# Patient Record
Sex: Female | Born: 1975 | Race: White | Hispanic: No | Marital: Married | Smoking: Current every day smoker
Health system: Southern US, Community
[De-identification: ages and names within clinical notes are randomized; demographics above are authoritative.]

## PROBLEM LIST (undated history)

## (undated) DIAGNOSIS — F419 Anxiety disorder, unspecified: Secondary | ICD-10-CM

## (undated) DIAGNOSIS — R0989 Other specified symptoms and signs involving the circulatory and respiratory systems: Secondary | ICD-10-CM

## (undated) DIAGNOSIS — F319 Bipolar disorder, unspecified: Secondary | ICD-10-CM

## (undated) DIAGNOSIS — N3010 Interstitial cystitis (chronic) without hematuria: Secondary | ICD-10-CM

## (undated) DIAGNOSIS — F32A Depression, unspecified: Secondary | ICD-10-CM

## (undated) DIAGNOSIS — F329 Major depressive disorder, single episode, unspecified: Secondary | ICD-10-CM

## (undated) HISTORY — PX: WISDOM TOOTH EXTRACTION: SHX21

## (undated) HISTORY — PX: OTHER SURGICAL HISTORY: SHX169

## (undated) HISTORY — PX: BREAST ENHANCEMENT SURGERY: SHX7

## (undated) HISTORY — PX: ABDOMINAL HYSTERECTOMY: SHX81

---

## 2000-09-11 ENCOUNTER — Other Ambulatory Visit: Admission: RE | Admit: 2000-09-11 | Discharge: 2000-09-11 | Payer: Self-pay | Admitting: Obstetrics and Gynecology

## 2001-05-15 ENCOUNTER — Encounter: Payer: Self-pay | Admitting: Obstetrics and Gynecology

## 2001-05-15 ENCOUNTER — Inpatient Hospital Stay (HOSPITAL_COMMUNITY): Admission: AD | Admit: 2001-05-15 | Discharge: 2001-05-15 | Payer: Self-pay | Admitting: Obstetrics and Gynecology

## 2006-09-08 DIAGNOSIS — N3010 Interstitial cystitis (chronic) without hematuria: Secondary | ICD-10-CM

## 2006-09-08 HISTORY — DX: Interstitial cystitis (chronic) without hematuria: N30.10

## 2009-10-13 ENCOUNTER — Emergency Department (HOSPITAL_BASED_OUTPATIENT_CLINIC_OR_DEPARTMENT_OTHER): Admission: EM | Admit: 2009-10-13 | Discharge: 2009-10-14 | Payer: Self-pay | Admitting: Emergency Medicine

## 2011-01-07 ENCOUNTER — Other Ambulatory Visit: Payer: Self-pay | Admitting: Family Medicine

## 2011-01-07 DIAGNOSIS — N6310 Unspecified lump in the right breast, unspecified quadrant: Secondary | ICD-10-CM

## 2011-01-13 ENCOUNTER — Ambulatory Visit
Admission: RE | Admit: 2011-01-13 | Discharge: 2011-01-13 | Disposition: A | Discharge status: Home / Self Care | Payer: Private Health Insurance - Indemnity | Source: Ambulatory Visit | Attending: Family Medicine | Admitting: Family Medicine

## 2011-01-13 DIAGNOSIS — N6310 Unspecified lump in the right breast, unspecified quadrant: Secondary | ICD-10-CM

## 2011-11-03 ENCOUNTER — Encounter (HOSPITAL_COMMUNITY): Payer: Self-pay | Admitting: *Deleted

## 2011-11-03 NOTE — Progress Notes (Signed)
Pt was called 11/03/11 for endoscopy pre call before her scheduled procedures with Dr. Bosie Clos 11/04/11.  Reviewed home meds with patient.  Pt takes Elmiron, which contains low molecular weight heparin.  Pt stated she had taken this already 11/03/11 in the AM.  Pt was encouraged to call Dr. Marge Duncans office and verify if Dr. Bosie Clos was okay with pt taking Elmiron the day before procedure.  Pt stated office had already talked to her and verified it was okay,as long as she did not take it the day of.  Angelique Blonder, RN

## 2011-11-04 ENCOUNTER — Ambulatory Visit (HOSPITAL_COMMUNITY)
Admission: RE | Admit: 2011-11-04 | Discharge: 2011-11-04 | Disposition: A | Discharge status: Home / Self Care | Payer: Private Health Insurance - Indemnity | Source: Ambulatory Visit | Attending: Gastroenterology | Admitting: Gastroenterology

## 2011-11-04 ENCOUNTER — Encounter (HOSPITAL_COMMUNITY): Payer: Self-pay | Admitting: Anesthesiology

## 2011-11-04 ENCOUNTER — Encounter (HOSPITAL_COMMUNITY)
Admission: RE | Disposition: A | Discharge status: Home / Self Care | Payer: Self-pay | Source: Ambulatory Visit | Attending: Gastroenterology

## 2011-11-04 ENCOUNTER — Ambulatory Visit (HOSPITAL_COMMUNITY): Payer: Private Health Insurance - Indemnity | Admitting: Anesthesiology

## 2011-11-04 ENCOUNTER — Encounter (HOSPITAL_COMMUNITY): Payer: Self-pay

## 2011-11-04 DIAGNOSIS — K5289 Other specified noninfective gastroenteritis and colitis: Secondary | ICD-10-CM | POA: Insufficient documentation

## 2011-11-04 DIAGNOSIS — K648 Other hemorrhoids: Secondary | ICD-10-CM | POA: Insufficient documentation

## 2011-11-04 DIAGNOSIS — K625 Hemorrhage of anus and rectum: Secondary | ICD-10-CM | POA: Insufficient documentation

## 2011-11-04 DIAGNOSIS — R634 Abnormal weight loss: Secondary | ICD-10-CM | POA: Insufficient documentation

## 2011-11-04 DIAGNOSIS — K921 Melena: Secondary | ICD-10-CM | POA: Insufficient documentation

## 2011-11-04 HISTORY — DX: Depression, unspecified: F32.A

## 2011-11-04 HISTORY — DX: Major depressive disorder, single episode, unspecified: F32.9

## 2011-11-04 HISTORY — DX: Anxiety disorder, unspecified: F41.9

## 2011-11-04 HISTORY — DX: Interstitial cystitis (chronic) without hematuria: N30.10

## 2011-11-04 SURGERY — ESOPHAGOGASTRODUODENOSCOPY (EGD) WITH PROPOFOL
Anesthesia: Monitor Anesthesia Care

## 2011-11-04 MED ORDER — SODIUM CHLORIDE 0.45 % IV SOLN: Freq: Once | INTRAVENOUS | Status: DC

## 2011-11-04 MED ORDER — PROPOFOL 10 MG/ML IV EMUL
INTRAVENOUS | Status: DC | PRN
  Administered 2011-11-04 (×2): 300 ug/kg/min via INTRAVENOUS

## 2011-11-04 MED ORDER — LACTATED RINGERS IV SOLN
Freq: Once | INTRAVENOUS | Status: AC
  Administered 2011-11-04: 09:00:00 via INTRAVENOUS

## 2011-11-04 MED ORDER — FENTANYL CITRATE 0.05 MG/ML IJ SOLN
INTRAMUSCULAR | Status: DC | PRN
  Administered 2011-11-04: 25 ug via INTRAVENOUS
  Administered 2011-11-04: 50 ug via INTRAVENOUS

## 2011-11-04 MED ORDER — MIDAZOLAM HCL 5 MG/5ML IJ SOLN
INTRAMUSCULAR | Status: DC | PRN
  Administered 2011-11-04: 2 mg via INTRAVENOUS

## 2011-11-04 MED ORDER — SODIUM CHLORIDE 0.9 % IV SOLN: Freq: Once | INTRAVENOUS | Status: DC

## 2011-11-04 MED ORDER — LACTATED RINGERS IV SOLN
INTRAVENOUS | Status: DC | PRN
  Administered 2011-11-04: 09:00:00 via INTRAVENOUS

## 2011-11-04 SURGICAL SUPPLY — 24 items

## 2011-11-04 NOTE — Anesthesia Preprocedure Evaluation (Signed)
Anesthesia Evaluation  Patient identified by MRN, date of birth, ID band Patient awake    Reviewed: Allergy & Precautions, H&P , NPO status , Patient's Chart, lab work & pertinent test results, reviewed documented beta blocker date and time   Airway Mallampati: II TM Distance: >3 FB Neck ROM: Full    Dental  (+) Teeth Intact   Pulmonary neg pulmonary ROS,  clear to auscultation        Cardiovascular neg cardio ROS Regular Normal Denies cardiac symptoms   Neuro/Psych Negative Neurological ROS  Negative Psych ROS   GI/Hepatic negative GI ROS, Neg liver ROS, Rectal bleeding   Endo/Other  Negative Endocrine ROS  Renal/GU negative Renal ROS   IC    Musculoskeletal negative musculoskeletal ROS (+)   Abdominal   Peds negative pediatric ROS (+)  Hematology negative hematology ROS (+) Pt denies anemia   Anesthesia Other Findings   Reproductive/Obstetrics negative OB ROS                           Anesthesia Physical Anesthesia Plan  ASA: II  Anesthesia Plan: MAC   Post-op Pain Management:    Induction: Intravenous  Airway Management Planned: Mask  Additional Equipment:   Intra-op Plan:   Post-operative Plan:   Informed Consent: I have reviewed the patients History and Physical, chart, labs and discussed the procedure including the risks, benefits and alternatives for the proposed anesthesia with the patient or authorized representative who has indicated his/her understanding and acceptance.   Dental advisory given  Plan Discussed with: CRNA and Surgeon  Anesthesia Plan Comments:         Anesthesia Quick Evaluation

## 2011-11-04 NOTE — Transfer of Care (Signed)
Immediate Anesthesia Transfer of Care Note  Patient: Michaela [name]  Procedure(s) Performed: Procedure(s) (LRB): ESOPHAGOGASTRODUODENOSCOPY (EGD) WITH PROPOFOL (N/A) COLONOSCOPY WITH PROPOFOL (N/A)  Patient Location: PACU  Anesthesia Type: MAC  Level of Consciousness: awake, sedated and patient cooperative  Airway & Oxygen Therapy: Patient Spontanous Breathing and Patient connected to nasal cannula oxygen  Post-op Assessment: Report given to PACU RN and Post -op Vital signs reviewed and stable  Post vital signs: Reviewed and stable  Complications: No apparent anesthesia complications

## 2011-11-04 NOTE — Op Note (Signed)
[facility] [address] [place], [place]  [address]  ENDOSCOPY PROCEDURE REPORT  PATIENT:  Michaela, [name]  MR#:  [id number] BIRTHDATE:  [birth year], 35 yrs. old  GENDER:  female  ENDOSCOPIST:  Charlott Rakes, MD Referred by:  Beverley Fiedler, M.D.  PROCEDURE DATE:  11/04/2011 PROCEDURE:  EGD, diagnostic 43235 ASA CLASS:  Class II INDICATIONS:  weight loss, red rectal bleeding  MEDICATIONS:   See Anesthesia Report.  TOPICAL ANESTHETIC:  DESCRIPTION OF PROCEDURE:   After the risks benefits and alternatives of the procedure were thoroughly explained, informed consent was obtained.  The  endoscope was introduced through the mouth and advanced to the second portion of the duodenum, without limitations.  The instrument was slowly withdrawn as the mucosa was fully examined. <<PROCEDUREIMAGES>>  FINDINGS:  The endoscope was inserted into the oropharynx and esophagus was intubated.  The gastroesophageal junction was noted to be 40cm from the incisors. Endoscope was advanced into the stomach, which was unremarkable. Retroflexion revealed a normal proximal stomach.  The endoscope was advanced to the duodenal bulb and second portion of the duodenum which were unremarkable.  The endoscope was withdrawn back into the stomach and the endoscope was then withdrawn to confirm the above findings.  COMPLICATIONS:  None  ENDOSCOPIC IMPRESSION:  1. Normal upper endoscopy  RECOMMENDATIONS:    1. Proceed with colonoscopy 2. Resume previous diet  REPEAT EXAM:  N/A  ______________________________ Charlott Rakes, MD  CC:  Dr. Beverley Fiedler  n. eSIGNED:   Charlott Rakes at 11/04/2011 10:27 AM  Durene Romans, [id number]

## 2011-11-04 NOTE — Anesthesia Postprocedure Evaluation (Signed)
  Anesthesia Post-op Note  Patient: Michaela [name]  Procedure(s) Performed: Procedure(s) (LRB): ESOPHAGOGASTRODUODENOSCOPY (EGD) WITH PROPOFOL (N/A) COLONOSCOPY WITH PROPOFOL (N/A)  Patient Location: PACU  Anesthesia Type: MAC  Level of Consciousness: oriented and sedated  Airway and Oxygen Therapy: Patient Spontanous Breathing  Post-op Pain: none  Post-op Assessment: Post-op Vital signs reviewed, Patient's Cardiovascular Status Stable, Respiratory Function Stable and Patent Airway  Post-op Vital Signs: stable  Complications: No apparent anesthesia complications

## 2011-11-04 NOTE — Op Note (Signed)
[facility] [address] [place], [place]  [address]  COLONOSCOPY PROCEDURE REPORT  PATIENT:  Michaela, [name]  MR#:  [id number] BIRTHDATE:  [birth year], 35 yrs. old  GENDER:  female ENDOSCOPIST:  Charlott Rakes, MD REF. BY:  Beverley Fiedler, M.D. PROCEDURE DATE:  11/04/2011 PROCEDURE:  Colonoscopy with biopsy ASA CLASS:  Class II INDICATIONS:  rectal bleeding, blood in stool, weight loss MEDICATIONS:   See Anesthesia Report.  DESCRIPTION OF PROCEDURE:   After the risks benefits and alternatives of the procedure were thoroughly explained, informed consent was obtained.  The Pentax Ped Colon [id number] endoscope was introduced through the anus and advanced to the cecum, which was identified by both the appendix and ileocecal valve, limited by fair prep.    The quality of the prep was fair.  The instrument was then slowly withdrawn as the colon was fully examined. <<PROCEDUREIMAGES>>  FINDINGS:  Rectal exam unremarkable.  Pediatric colonoscope inserted into the colon and advanced to the cecum, where the appendiceal orifice and ileocecal valve were identified.    On insertion there were a few small flat red spots in the sigmoid colon. The cecal mucosa was diffusely nodular without any ulceration or erythema noted. The terminal ileum was intubated and was normal in appearance. Biopsies were taken of the TI to send for histologic purposes. Biopsies were taken of the cecum, ascending colon and also random left-sided colonic biopsies were taken. The transverse and descending colon appeared normal. The sigmoid colon was friable and had patchy edema and biopsies were taken. The rectal mucosa were normal in appearance. Moderate amount of semi-solid stool that prevented complete visualization of the sigmoid colon.  Retroflexion revealed small internal hemorrhoids.  COMPLICATIONS:  None  IMPRESSION:  1. Nonspecific nodularity and edema in the proximal colon-s/p biopsies (may  be prep related) 2. Friable sigmoid colon-s/p biopsies to check for inflammation and endometriosis 3. Small internal hemorrhoids  RECOMMENDATIONS:    1. F/U on path 2. Avoid ibuprofen products  ______________________________ Charlott Rakes, MD  CC:  Beverley Fiedler, MD  n. Rosalie DoctorCharlott Rakes at 11/04/2011 10:41 AM  Durene Romans, [phone]

## 2011-11-04 NOTE — H&P (Signed)
  See scanned H and P dated 10/24/11. History reviewed, patient examined, no change in status, stable for endoscopic procedure.

## 2011-11-04 NOTE — Discharge Instructions (Addendum)
Will call you the results of the biopsies when available.  Endoscopy Care After Please read the instructions outlined below and refer to this sheet in the next few weeks. These discharge instructions provide you with general information on caring for yourself after you leave the hospital. Your doctor may also give you specific instructions. While your treatment has been planned according to the most current medical practices available, unavoidable complications occasionally occur. If you have any problems or questions after discharge, please call your doctor. HOME CARE INSTRUCTIONS Activity  You may resume your regular activity but move at a slower pace for the next 24 hours.   Take frequent rest periods for the next 24 hours.   Walking will help expel (get rid of) the air and reduce the bloated feeling in your abdomen.   No driving for 24 hours (because of the anesthesia (medicine) used during the test).   You may shower.   Do not sign any important legal documents or operate any machinery for 24 hours (because of the anesthesia used during the test).  Nutrition  Drink plenty of fluids.   You may resume your normal diet.   Begin with a light meal and progress to your normal diet.   Avoid alcoholic beverages for 24 hours or as instructed by your caregiver.  Medications You may resume your normal medications unless your caregiver tells you otherwise. What you can expect today  You may experience abdominal discomfort such as a feeling of fullness or "gas" pains.   You may experience a sore throat for 2 to 3 days. This is normal. Gargling with salt water may help this.  Follow-up Your doctor will discuss the results of your test with you. SEEK IMMEDIATE MEDICAL CARE IF:  You have excessive nausea (feeling sick to your stomach) and/or vomiting.   You have severe abdominal pain and distention (swelling).   You have trouble swallowing.   You have a temperature over 100 F  (37.8 C).   You have rectal bleeding or vomiting of blood.  Document Released: 04/08/2004 Document Revised: 05/07/2011 Document Reviewed: 10/20/2007 [facility] Patient Information 2012 [place], [place].  Colonoscopy Care After Read the instructions outlined below and refer to this sheet in the next few weeks. These discharge instructions provide you with general information on caring for yourself after you leave the hospital. Your doctor may also give you specific instructions. While your treatment has been planned according to the most current medical practices available, unavoidable complications occasionally occur. If you have any problems or questions after discharge, call your doctor. HOME CARE INSTRUCTIONS ACTIVITY:  You may resume your regular activity, but move at a slower pace for the next 24 hours.   Take frequent rest periods for the next 24 hours.   Walking will help get rid of the air and reduce the bloated feeling in your belly (abdomen).   No driving for 24 hours (because of the medicine (anesthesia) used during the test).   You may shower.   Do not sign any important legal documents or operate any machinery for 24 hours (because of the anesthesia used during the test).  NUTRITION:  Drink plenty of fluids.   You may resume your normal diet as instructed by your doctor.   Begin with a light meal and progress to your normal diet. Heavy or fried foods are harder to digest and may make you feel sick to your stomach (nauseated).   Avoid alcoholic beverages for 24 hours or as instructed.  MEDICATIONS:  You may resume your normal medications unless your doctor tells you otherwise.  WHAT TO EXPECT TODAY:  Some feelings of bloating in the abdomen.   Passage of more gas than usual.   Spotting of blood in your stool or on the toilet paper.  IF YOU HAD POLYPS REMOVED DURING THE COLONOSCOPY:  No aspirin products for 7 days or as instructed.   No alcohol for 7 days or as  instructed.   Eat a soft diet for the next 24 hours.  FINDING OUT THE RESULTS OF YOUR TEST Not all test results are available during your visit. If your test results are not back during the visit, make an appointment with your caregiver to find out the results. Do not assume everything is normal if you have not heard from your caregiver or the medical facility. It is important for you to follow up on all of your test results.  SEEK IMMEDIATE MEDICAL CARE IF:  You have more than a spotting of blood in your stool.   Your belly is swollen (abdominal distention).   You are nauseated or vomiting.   You have a fever.   You have abdominal pain or discomfort that is severe or gets worse throughout the day.  Document Released: 04/08/2004 Document Revised: 05/07/2011 Document Reviewed: 04/06/2008 [facility] Patient Information 2012 [place], [place].

## 2011-11-04 NOTE — Interval H&P Note (Signed)
History and Physical Interval Note:  11/04/2011 9:29 AM  Michaela [name]  has presented today for surgery, with the diagnosis of rectal bleeding/unintentional weight loss  The various methods of treatment have been discussed with the patient and family. After consideration of risks, benefits and other options for treatment, the patient has consented to  Procedure(s) (LRB): ESOPHAGOGASTRODUODENOSCOPY (EGD) WITH PROPOFOL (N/A) COLONOSCOPY WITH PROPOFOL (N/A) as a surgical intervention .  The patients' history has been reviewed, patient examined, no change in status, stable for surgery.  I have reviewed the patients' chart and labs.  Questions were answered to the patient's satisfaction.     [name]

## 2011-11-04 NOTE — Brief Op Note (Signed)
See endopro note 

## 2011-11-13 ENCOUNTER — Other Ambulatory Visit: Payer: Self-pay | Admitting: Gastroenterology

## 2011-11-13 DIAGNOSIS — K5010 Crohn's disease of large intestine without complications: Secondary | ICD-10-CM

## 2011-11-24 ENCOUNTER — Other Ambulatory Visit: Payer: Private Health Insurance - Indemnity

## 2011-11-25 ENCOUNTER — Ambulatory Visit
Admission: RE | Admit: 2011-11-25 | Discharge: 2011-11-25 | Disposition: A | Discharge status: Home / Self Care | Payer: Private Health Insurance - Indemnity | Source: Ambulatory Visit | Attending: Gastroenterology | Admitting: Gastroenterology

## 2011-11-25 ENCOUNTER — Other Ambulatory Visit: Payer: Private Health Insurance - Indemnity

## 2011-11-25 DIAGNOSIS — K5010 Crohn's disease of large intestine without complications: Secondary | ICD-10-CM

## 2011-11-25 MED ORDER — IOHEXOL 300 MG/ML  SOLN
100.0000 mL | Freq: Once | INTRAMUSCULAR | Status: AC | PRN
  Administered 2011-11-25: 100 mL via INTRAVENOUS

## 2013-02-17 IMAGING — CT CT ENTEROGRAPHY (ABD-PELV W/ CM)
3 of 5 series · 12 of 46 positions shown, 19 images · IV contrast ([ID] [id number] & [ID] [id number])
Comparison: None.

CLINICAL DATA: History of Crohn's colitis

CT ABDOMEN AND PELVIS WITH CONTRAST (CT ENTEROGRAPHY)
TECHNIQUE: Multidetector CT of the abdomen and pelvis during bolus
administration of intravenous contrast. Negative oral contrast
VoLumen was given.
Contrast: 100mL OMNIPAQUE IOHEXOL 300 MG/ML IJ SOLN

[Series 3: enterography 5mm · axial · 0.65mm/px · z∈[-402,-77]mm · 8 of 85 slices shown, 13 images]
[im 10/85  soft-tissue]
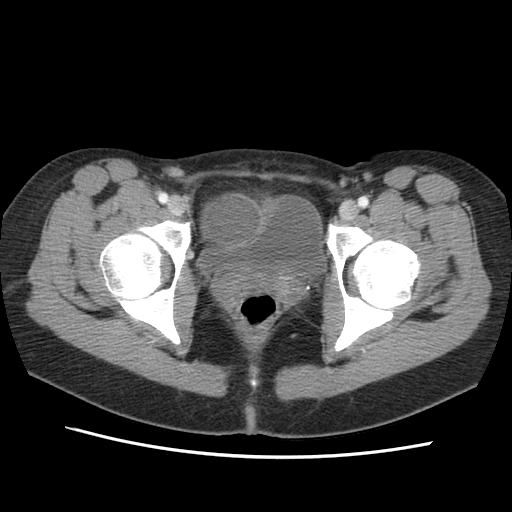
[im 10/85  bone]
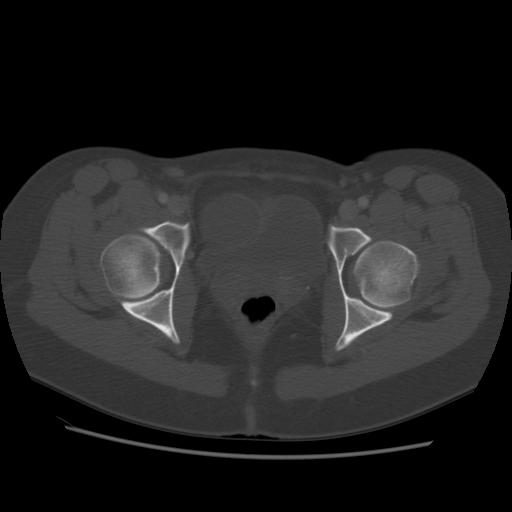
[im 19/85  soft-tissue]
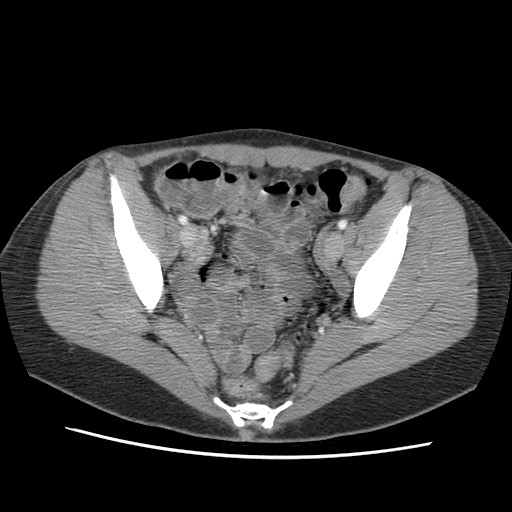
[im 29/85  soft-tissue]
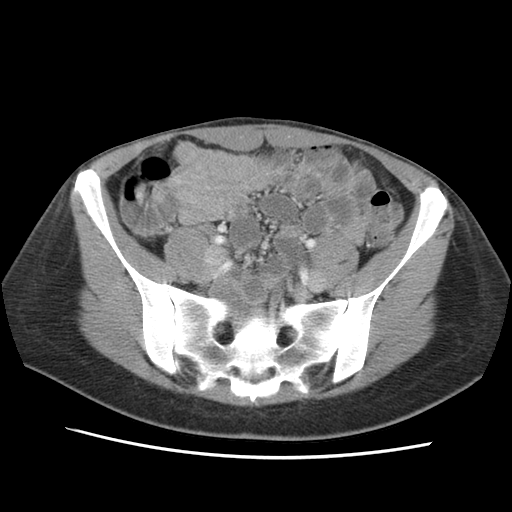
[im 38/85  soft-tissue]
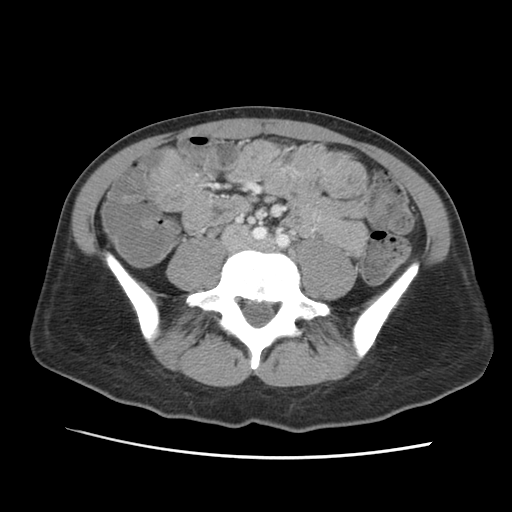
[im 47/85  soft-tissue]
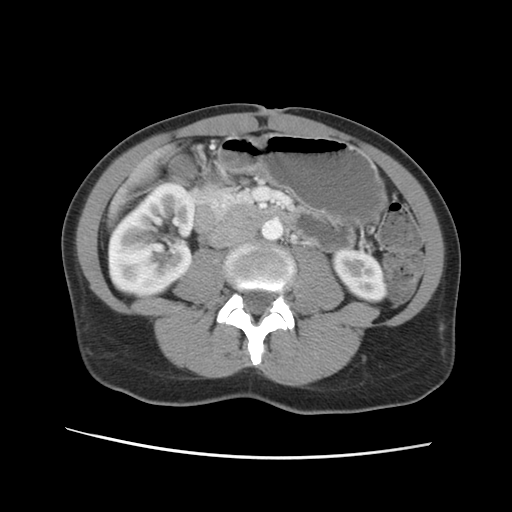
[im 47/85  lung]
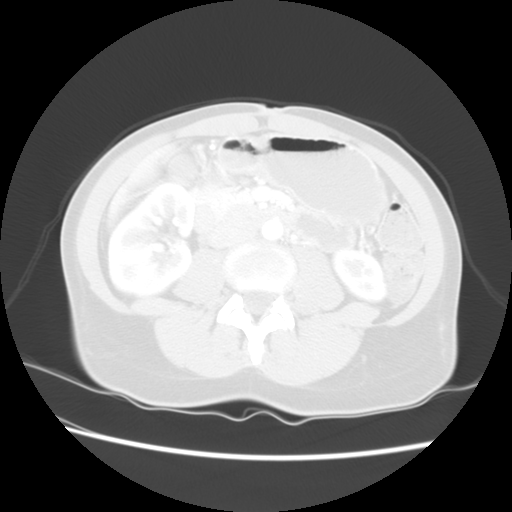
[im 57/85  soft-tissue]
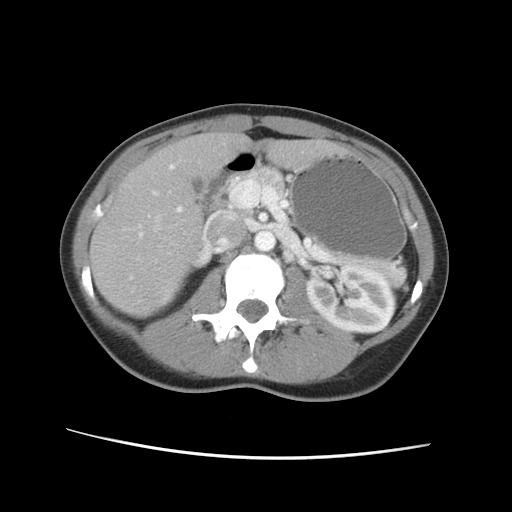
[im 57/85  lung]
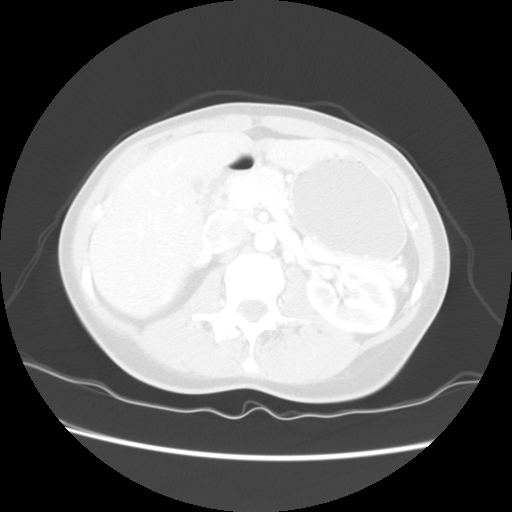
[im 66/85  soft-tissue]
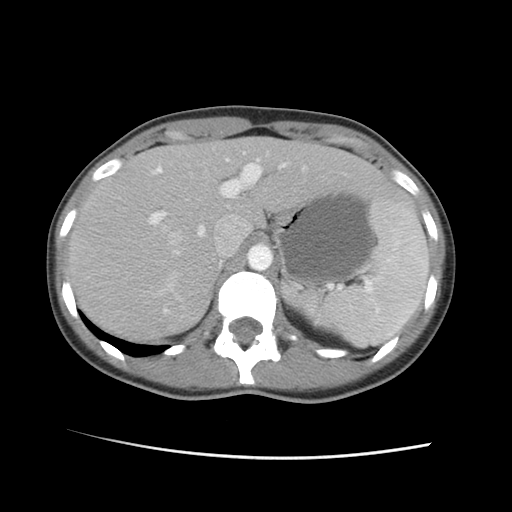
[im 66/85  lung]
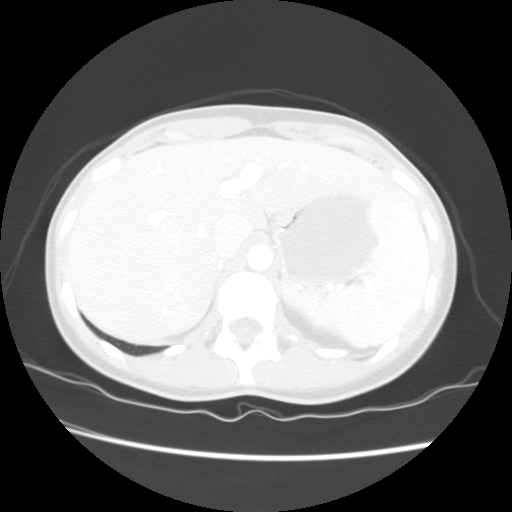
[im 75/85  soft-tissue]
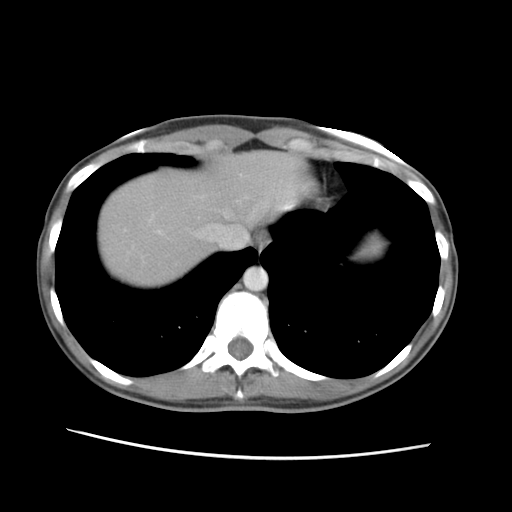
[im 75/85  lung]
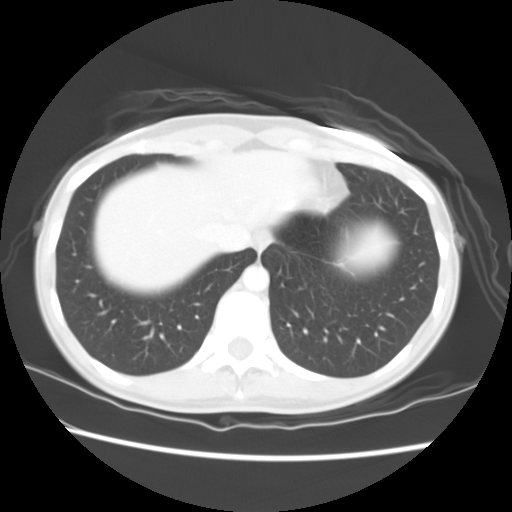

[Series 400: cor · coronal · 0.92mm/px · 3 of 94 slices shown, 4 images]
[im 32/94  soft-tissue]
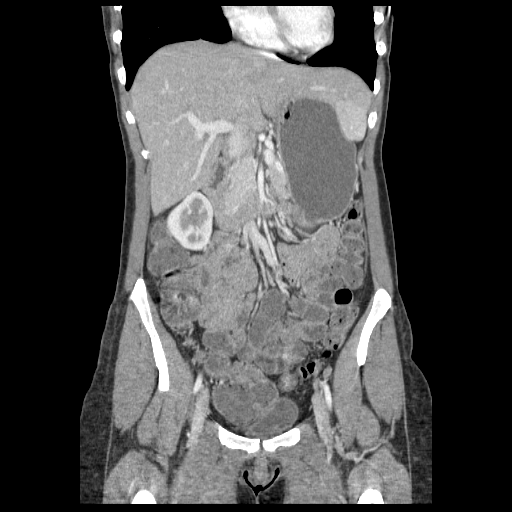
[im 42/94  soft-tissue]
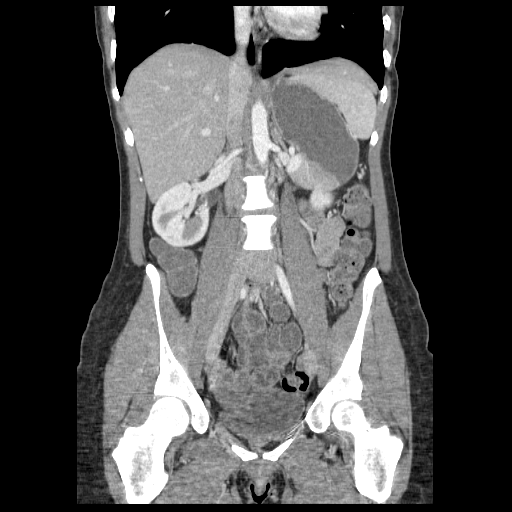
[im 42/94  bone]
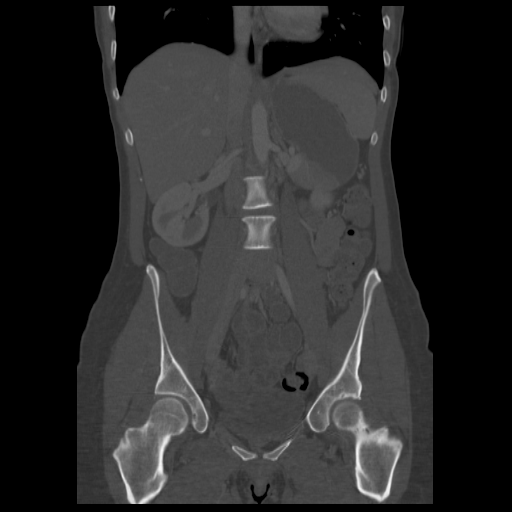
[im 52/94  soft-tissue]
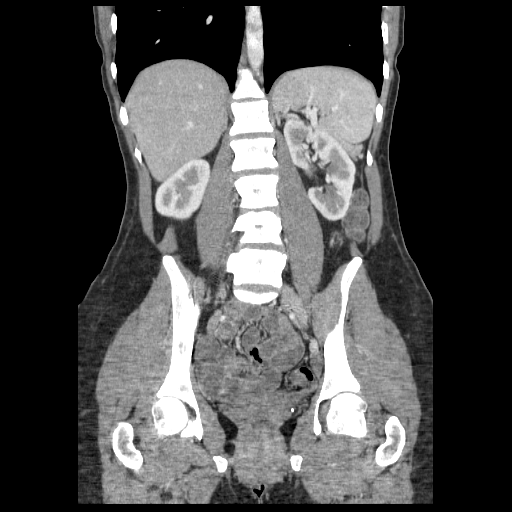

[Series 401: sag · sagittal · 0.92mm/px · 1 of 127 slices shown, 2 images]
[im 43/127  soft-tissue]
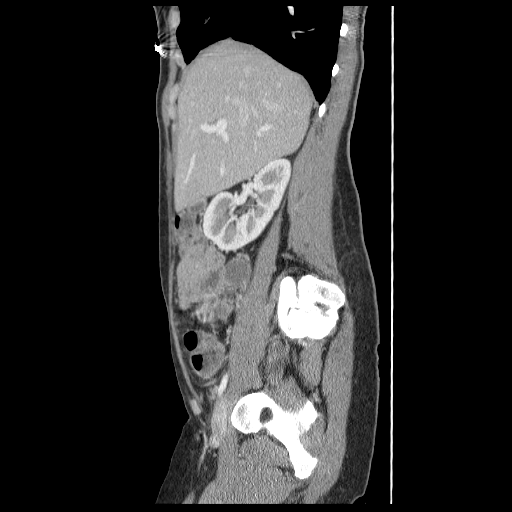
[im 43/127  bone]
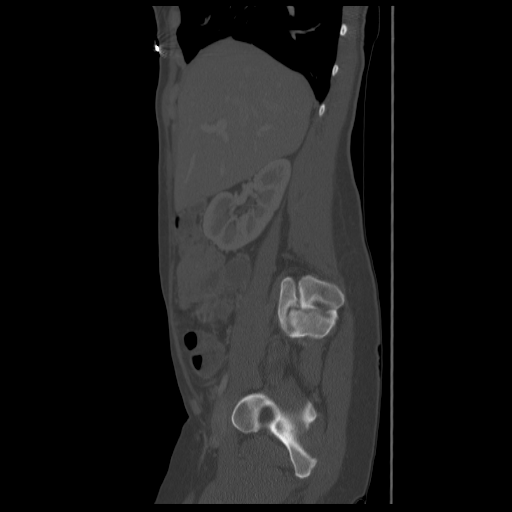

[12 of 46 positions shown; findings below may reference images not displayed]

FINDINGS: The lung bases are clear.  The liver enhances with no
focal abnormality and no ductal dilatation is seen.  No calcified
gallstones are noted.  The pancreas is normal in size and the
pancreatic duct is not dilated.  The adrenal glands and spleen are
unremarkable.  The stomach is moderately fluid distended with no
abnormality noted.  The kidneys enhance with no calculus or mass
and no hydronephrosis is seen.  The abdominal aorta is normal in
caliber.  No adenopathy is noted.  The mesenteric vasculature is
patent.

The small bowel is moderately well distended with the exception of
the very distal ileum which is decompressed.  No enhancing focus is
seen and no mucosal edema is noted.  There is feces throughout the
colon with some fluid but no focal colonic mucosal thickening or
enhancement is seen.  The urinary bladder is decompressed.  No free
fluid is noted within the pelvis.  The uterus has previously been
resected.  No bony abnormality is seen.
IMPRESSION: Negative CT enterography. No evidence of mucosal edema or
enhancement of the mucosa of large or small bowel is seen.

## 2014-01-11 ENCOUNTER — Inpatient Hospital Stay (HOSPITAL_COMMUNITY)
Admission: AD | Admit: 2014-01-11 | Discharge: 2014-01-16 | DRG: 885 | Disposition: A | Discharge status: Home / Self Care | Payer: Private Health Insurance - Indemnity | Source: Intra-hospital | Attending: Psychiatry | Admitting: Psychiatry

## 2014-01-11 ENCOUNTER — Ambulatory Visit (HOSPITAL_COMMUNITY)
Admission: AD | Admit: 2014-01-11 | Discharge: 2014-01-11 | Disposition: A | Discharge status: Home / Self Care | Payer: Private Health Insurance - Indemnity | Attending: Psychiatry | Admitting: Psychiatry

## 2014-01-11 ENCOUNTER — Emergency Department (HOSPITAL_COMMUNITY)
Admission: EM | Admit: 2014-01-11 | Discharge: 2014-01-11 | Disposition: A | Discharge status: Other Acute Inpatient Hospital | Payer: Private Health Insurance - Indemnity | Attending: Emergency Medicine | Admitting: Emergency Medicine

## 2014-01-11 ENCOUNTER — Encounter (HOSPITAL_COMMUNITY): Payer: Self-pay | Admitting: Emergency Medicine

## 2014-01-11 ENCOUNTER — Encounter (HOSPITAL_COMMUNITY): Payer: Self-pay

## 2014-01-11 DIAGNOSIS — R45851 Suicidal ideations: Secondary | ICD-10-CM

## 2014-01-11 DIAGNOSIS — G479 Sleep disorder, unspecified: Secondary | ICD-10-CM | POA: Insufficient documentation

## 2014-01-11 DIAGNOSIS — F411 Generalized anxiety disorder: Secondary | ICD-10-CM | POA: Diagnosis present

## 2014-01-11 DIAGNOSIS — Z87448 Personal history of other diseases of urinary system: Secondary | ICD-10-CM | POA: Insufficient documentation

## 2014-01-11 DIAGNOSIS — Z3202 Encounter for pregnancy test, result negative: Secondary | ICD-10-CM | POA: Insufficient documentation

## 2014-01-11 DIAGNOSIS — G4700 Insomnia, unspecified: Secondary | ICD-10-CM | POA: Diagnosis present

## 2014-01-11 DIAGNOSIS — F3289 Other specified depressive episodes: Secondary | ICD-10-CM | POA: Insufficient documentation

## 2014-01-11 DIAGNOSIS — F32A Depression, unspecified: Secondary | ICD-10-CM

## 2014-01-11 DIAGNOSIS — F410 Panic disorder [episodic paroxysmal anxiety] without agoraphobia: Secondary | ICD-10-CM | POA: Diagnosis present

## 2014-01-11 DIAGNOSIS — F312 Bipolar disorder, current episode manic severe with psychotic features: Principal | ICD-10-CM | POA: Diagnosis present

## 2014-01-11 DIAGNOSIS — F17200 Nicotine dependence, unspecified, uncomplicated: Secondary | ICD-10-CM | POA: Diagnosis present

## 2014-01-11 DIAGNOSIS — Z9119 Patient's noncompliance with other medical treatment and regimen: Secondary | ICD-10-CM

## 2014-01-11 DIAGNOSIS — Z79899 Other long term (current) drug therapy: Secondary | ICD-10-CM | POA: Insufficient documentation

## 2014-01-11 DIAGNOSIS — F329 Major depressive disorder, single episode, unspecified: Secondary | ICD-10-CM | POA: Insufficient documentation

## 2014-01-11 DIAGNOSIS — Z91199 Patient's noncompliance with other medical treatment and regimen due to unspecified reason: Secondary | ICD-10-CM

## 2014-01-11 LAB — CBC
HEMATOCRIT: 41.4 % (ref 36.0–46.0)
HEMOGLOBIN: 13.5 g/dL (ref 12.0–15.0)
MCH: 32.4 pg (ref 26.0–34.0)
MCHC: 32.6 g/dL (ref 30.0–36.0)
MCV: 99.3 fL (ref 78.0–100.0)
Platelets: 488 10*3/uL — ABNORMAL HIGH (ref 150–400)
RBC: 4.17 MIL/uL (ref 3.87–5.11)
RDW: 13.9 % (ref 11.5–15.5)
WBC: 10.9 10*3/uL — AB (ref 4.0–10.5)

## 2014-01-11 LAB — BASIC METABOLIC PANEL
BUN: 6 mg/dL (ref 6–23)
CHLORIDE: 106 meq/L (ref 96–112)
CO2: 24 meq/L (ref 19–32)
Calcium: 9.8 mg/dL (ref 8.4–10.5)
Creatinine, Ser: 0.86 mg/dL (ref 0.50–1.10)
GFR calc Af Amer: >90 mL/min (ref >90)
GFR calc non Af Amer: 85 mL/min — ABNORMAL LOW (ref >90)
GLUCOSE: 96 mg/dL (ref 70–99)
Potassium: 3.7 mEq/L (ref 3.7–5.3)
Sodium: 143 mEq/L (ref 137–147)

## 2014-01-11 LAB — LITHIUM LEVEL: Lithium Lvl: 0.63 mEq/L — ABNORMAL LOW (ref 0.80–1.40)

## 2014-01-11 LAB — RAPID URINE DRUG SCREEN, HOSP PERFORMED
AMPHETAMINES: NOT DETECTED
Barbiturates: NOT DETECTED
Benzodiazepines: NOT DETECTED
Cocaine: NOT DETECTED
Opiates: NOT DETECTED
TETRAHYDROCANNABINOL: NOT DETECTED

## 2014-01-11 LAB — ETHANOL

## 2014-01-11 LAB — PREGNANCY, URINE: PREG TEST UR: NEGATIVE

## 2014-01-11 MED ORDER — ALUM & MAG HYDROXIDE-SIMETH 200-200-20 MG/5ML PO SUSP: 30.0000 mL | ORAL | Status: DC | PRN

## 2014-01-11 MED ORDER — NICOTINE 14 MG/24HR TD PT24
14.0000 mg | MEDICATED_PATCH | Freq: Every day | TRANSDERMAL | Status: DC
  Administered 2014-01-11 – 2014-01-12 (×2): 14 mg via TRANSDERMAL
  Filled 2014-01-11 (×6): qty 1

## 2014-01-11 MED ORDER — HYDROXYZINE HCL 25 MG PO TABS
25.0000 mg | ORAL_TABLET | Freq: Four times a day (QID) | ORAL | Status: DC | PRN
  Administered 2014-01-11 – 2014-01-15 (×6): 25 mg via ORAL
  Filled 2014-01-11 (×6): qty 1

## 2014-01-11 MED ORDER — ACETAMINOPHEN 325 MG PO TABS
650.0000 mg | ORAL_TABLET | Freq: Four times a day (QID) | ORAL | Status: DC | PRN
  Administered 2014-01-12 (×2): 650 mg via ORAL
  Filled 2014-01-11 (×2): qty 2

## 2014-01-11 MED ORDER — TRAZODONE HCL 50 MG PO TABS
50.0000 mg | ORAL_TABLET | Freq: Every evening | ORAL | Status: DC | PRN
  Administered 2014-01-11 – 2014-01-12 (×4): 50 mg via ORAL
  Filled 2014-01-11 (×10): qty 1

## 2014-01-11 MED ORDER — MAGNESIUM HYDROXIDE 400 MG/5ML PO SUSP: 30.0000 mL | Freq: Every day | ORAL | Status: DC | PRN

## 2014-01-11 NOTE — ED Notes (Signed)
Pt not wanting to be forthcoming in triage but started talking to Dr. Patria Maneampos.  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  Never got a right fit for her medication.  States trouble with weight issues.  Developed anorexia.  Has interstitial cystitis.  Recently got steroid injections.  Gained 45 lbs.  Which effected her emotionally.  Started cutting herself all over her body to "release the pain" a few days ago.  Began having memory issues where she could not remember "anything".  Thought she may be getting dementia.  Was learning a new job and was not able to remember things from day to day.  Began getting migraines from stress.  CT scan was performed.  CT negative.  Pt is taking lithium and lamictal that were upped 3 wks ago.  Not able to see any relief.  Things got "really bad" today.  Bouts of insomnia and then will sleep for 2 days.  Normally a very aesthetically focused person with hair, make up, nails, all done.  Husband states she hasn't showered in 3 wks.  Made her shower yesterday.

## 2014-01-11 NOTE — ED Provider Notes (Signed)
CSN: [id number]     Arrival date & time 01/11/14  1615 History   First MD Initiated Contact with Patient 01/11/14 1627     Chief Complaint  Patient presents with  . Medical Clearance      HPI Patient presents with increasing anxiety and poor sleep.  She's had increased cutting behavior.  She was sent to the emergency department after discussions with her psychiatrist as her psychiatrist believed that she needed to be acutely hospitalized for worsening depression.  The patient's hygiene is been poor and the husband reports that she has not taken a shower in the past 3 weeks.  Patient has significant body figure issues.  She has a history of anorexia.  She's had significant increase in her weight lately which is affecting her mood.   Past Medical History  Diagnosis Date  . Anxiety   . Depression   . Interstitial cystitis 2008    "from 1 to 10 I'm a 10"   Past Surgical History  Procedure Laterality Date  . Bladder stretch      for interstitial cystitis  . Wisdom tooth extraction    . Abdominal hysterectomy     Family History  Problem Relation Age of Onset  . Hypotension Neg Hx   . Anesthesia problems Neg Hx   . Malignant hyperthermia Neg Hx   . Pseudochol deficiency Neg Hx    History  Substance Use Topics  . Smoking status: Never Smoker   . Smokeless tobacco: Not on file  . Alcohol Use: No   OB History   Grav Para Term Preterm Abortions TAB SAB Ect Mult Living                 Review of Systems  All other systems reviewed and are negative.     Allergies  Review of patient's allergies indicates not on file.  Home Medications   Prior to Admission medications   Medication Sig Start Date End Date Taking? Authorizing Provider  buPROPion (WELLBUTRIN) 100 MG tablet Take 100 mg by mouth daily.    Historical Provider, MD  HYDROcodone-acetaminophen (NORCO) 10-325 MG per tablet Take 1 tablet by mouth every 8 (eight) hours as needed.    Historical Provider, MD  hydrOXYzine  (ATARAX/VISTARIL) 25 MG tablet Take 50 mg by mouth at bedtime.    Historical Provider, MD  lamoTRIgine (LAMICTAL) 100 MG tablet Take 100 mg by mouth daily.    Historical Provider, MD  pentosan polysulfate (ELMIRON) 100 MG capsule Take 100 mg by mouth 2 (two) times daily.    Historical Provider, MD   BP 129/85  Pulse 80  Temp(Src) 97.7 F (36.5 C) (Oral)  Resp 14  SpO2 100% Physical Exam  Nursing note and vitals reviewed. Constitutional: She is oriented to person, place, and time. She appears well-developed and well-nourished.  HENT:  Head: Normocephalic.  Eyes: EOM are normal.  Neck: Normal range of motion.  Pulmonary/Chest: Effort normal.  Abdominal: She exhibits no distension.  Musculoskeletal: Normal range of motion.  Neurological: She is alert and oriented to person, place, and time.  Psychiatric: Her behavior is normal. Judgment and thought content normal. Her affect is labile. Her speech is delayed. Cognition and memory are normal. She exhibits a depressed mood. She expresses no homicidal ideation.    ED Course  Procedures (including critical care time) Labs Review Labs Reviewed - No data to display  Imaging Review No results found.   EKG Interpretation None      MDM  Final diagnoses:  None    We'll obtain lithium level.  Plan will [facility] once medically clear.    Lyanne CoKevin M Zina Pitzer, MD 01/11/14 [phone]

## 2014-01-11 NOTE — Tx Team (Signed)
Initial Interdisciplinary Treatment Plan  PATIENT STRENGTHS: (choose at least two) General fund of knowledge  PATIENT STRESSORS: Medication change or noncompliance   PROBLEM LIST: Problem List/Patient Goals Date to be addressed Date deferred Reason deferred Estimated date of resolution  Depression 01/11/14     Risk for suicide 01/11/14                                                DISCHARGE CRITERIA:  Improved stabilization in mood, thinking, and/or behavior Verbal commitment to aftercare and medication compliance  PRELIMINARY DISCHARGE PLAN: Attend aftercare/continuing care group Attend 12-step recovery group  PATIENT/FAMIILY INVOLVEMENT: This treatment plan has been presented to and reviewed with the patient, Michaela [name].  The patient and family have been given the opportunity to ask questions and make suggestions.  Delos HaringMichael A Hamdi Kley 01/11/2014, 9:13 PM

## 2014-01-11 NOTE — ED Notes (Signed)
Report given to ERIC, RN

## 2014-01-11 NOTE — Progress Notes (Signed)
Patient ID: Michaela [name], female   DOB: [birth year], 38 y.o.   MRN: [id number]  Admission Note:  D:37  yr female who presents [facility] in no acute distress for the treatment of SI and Depression. Pt appears flat and depressed. Pt was calm and cooperative with admission process. Pt presents with passive SI and contracts for safety upon admission. Pt denies AVH/pain at this time . Pt stated her Psychiatrist suggested she come in due to pt feeling suicidal and starting to cut again and increased depression x 1 week. Pt stated her depression started 8 yrs ago with post partum dep. And she has been cycling between mania and depression for the last 8 years. Pt stated she started cutting again a week ago.   A:POC and unit policies explained and understanding verbalized. Consents obtained. Food and fluids offered, and fluids accepted.   R:Pt had no additional questions or concerns.
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
## 2014-01-11 NOTE — [facility] Group Notes (Deleted)
Adult Psychoeducational Group Note  Date:  01/11/2014 Time:  10:56 PM  Group Topic/Focus:  Wrap-Up Group:   The focus of this group is to help patients review their daily goal of treatment and discuss progress on daily workbooks.  Participation Level:  Minimal  Participation Quality:  Appropriate  Affect:  Flat  Cognitive:  Oriented  Insight: Appropriate  Engagement in Group:  Limited  Modes of Intervention:  Discussion  Additional Comments:  Pt. Stated that her goal was to find placement. She stated that she has found placement and she plans on going back to school because she loves to care for animals especially dogs, once she is discharged.  Zacchary Pompei L Maigan Bittinger 01/11/2014, 10:56 PM

## 2014-01-11 NOTE — BH Assessment (Signed)
[facility] per Dr Evelene CroonKaur accompanied by husband Otilio Jeffersonharles Gotts.   Patient sent in by her psychiatrist Dr Evelene CroonKaur after being seen by Dr Evelene CroonKaur today. List of patient's psycghotropic medications sent from Dr Evelene CroonKaur along with details from today's visit. Significant notes from Dr Evelene CroonKaur include: - Patient cutting on self to feel better - "Just not well @ all; no energy @ all" - "First shower yesterday since 4/14. Does not have any energy to do anything." - "Can't remember any thing. Couldn't remember Carrot, Piano, Mailbox and Yellow" for Dr Evelene CroonKaur. Also could not provide today's dater or day of week.  - Could not do simple subtraction of 100-7 Constantly repeated "7 from 100, you mean 3?" Could not do it - Recently had CT scan of brain 3 weeks ago due to migraine - "She really had to struggle. Extreme psychomotor _  (unable to read one word) retarded"  Husband reports patient has been out of work for the last three weeks with increased paranoia, insomnia, rapid cycling mood swings and decompensation in hygiene. Patient has no SI/HI/AVH but is experiencing paranoia and realizes she is decompensating. Has been out of work for three weeks following migraine and feels she would lose her position if she returned now.   Patient reports weight gain of 45 pounds over last 5-6 months due to medication treatments for her IC condition. Reports going from size -0- to current size 10 has her depressed and feeling obese.  Patient reports recent cutting and reports "It felt good to feel the pain and especially to watch the blood drip." Pt has small cuts on one forearm and nicks on upper right.   [facility] Heinrichina Tate and PA Shelda JakesNeal Mashburn, recommended for inpatient placement pending medical clearance. Patient accepted to [facility].   Patient signed  voluntarily paperwork and support paperwork as Dr Evelene CroonKaur gave choice of voluntary or IVC. Patient much calmer as assessment progressed. Husband  reports "this is simply her pattern of rapid cycling." Of the five medications on list sent in by Dr Evelene CroonKaur patient reports she only takes one and advises of additional medication also prescribed by Dr Evelene CroonKaur but not listed.   Carney Bernatherine C Henry Utsey, LCSW

## 2014-01-12 DIAGNOSIS — R45851 Suicidal ideations: Secondary | ICD-10-CM

## 2014-01-12 MED ORDER — BUPROPION HCL ER (XL) 150 MG PO TB24
150.0000 mg | ORAL_TABLET | Freq: Every day | ORAL | Status: DC
  Administered 2014-01-12 – 2014-01-16 (×5): 150 mg via ORAL
  Filled 2014-01-12 (×7): qty 1

## 2014-01-12 MED ORDER — LURASIDONE HCL 40 MG PO TABS
40.0000 mg | ORAL_TABLET | Freq: Every day | ORAL | Status: DC
  Administered 2014-01-13 – 2014-01-14 (×2): 40 mg via ORAL
  Filled 2014-01-12 (×4): qty 1

## 2014-01-12 MED ORDER — DIAZEPAM 5 MG PO TABS
5.0000 mg | ORAL_TABLET | Freq: Two times a day (BID) | ORAL | Status: DC | PRN
  Administered 2014-01-12 – 2014-01-16 (×5): 5 mg via ORAL
  Filled 2014-01-12 (×5): qty 1

## 2014-01-12 NOTE — [facility] Suicide Risk Assessment (Signed)
[facility] INPATIENT:  Family/Significant Other Suicide Prevention Education  Suicide Prevention Education:  Education Completed; Michaela [name] - husband [phone]),  (name of family member/significant other) has been identified by the patient as the family member/significant other with whom the patient will be residing, and identified as the person(s) who will aid the patient in the event of a mental health crisis (suicidal ideations/suicide attempt).  With written consent from the patient, the family member/significant other has been provided the following suicide prevention education, prior to the and/or following the discharge of the patient.  The suicide prevention education provided includes the following:  Suicide risk factors  Suicide prevention and interventions  National Suicide Hotline telephone number  [facility] telephone number  [facility] Emergency Assistance 911  [facility] and/or Residential Mobile Crisis Unit telephone number  Request made of family/significant other to:  Remove weapons (e.g., guns, rifles, knives), all items previously/currently identified as safety concern.    Remove drugs/medications (over-the-counter, prescriptions, illicit drugs), all items previously/currently identified as a safety concern.  The family member/significant other verbalizes understanding of the suicide prevention education information provided.  The family member/significant other agrees to remove the items of safety concern listed above.  Michaela [name] 01/12/2014, 11:46 AM
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
## 2014-01-12 NOTE — Progress Notes (Signed)
The focus of this group is to educate the patient on the purpose and policies of crisis stabilization and provide a format to answer questions about their admission.  The group details unit policies and expectations of patients while admitted.  Patient attended 0900 nurse education orientation group this morning.  Patient actively participated, appropriate affect, alert, appropriate insight and engagement.  Today patient will work on 3 goals for discharge.  

## 2014-01-12 NOTE — Progress Notes (Addendum)
D:  Patient's self inventory sheet, patient sleeps well, good appetite, low energy level, poor attention span.  Rated depression and hopeless 5.  Stated she has experienced tremors in past 24 hours.  SI off/on, no plan, contracts for safety.  Has experienced headache in past 24 hours.  Stated she feels "numb".  Plans to be active, clean, work, after discharge.  Plans to return home.  Has insurance, no problems with meds. A:  Medications administered per MD orders.  Emotional support and encouragement given patient. R:  Denied HI.  Denied A/V hallucination at this time.  Will continue to monitor patient for safety with 15 minute checks.  Safety maintained. Stated she has history of migraines, flashing lights.  Goes to ER, injections for headaches.   Patient's husband visited tonight.  Patient was sitting on bed after dinner talking to him, smiling.  Patient told nurse her headache was #6.

## 2014-01-12 NOTE — [facility] Group Notes (Signed)
[facility] LCSW Group Therapy  01/12/2014  1:15 PM   Type of Therapy:  Group Therapy  Participation Level:  Active  Participation Quality:  Attentive, Sharing and Supportive  Affect:  Depressed and Flat  Cognitive:  Alert and Oriented  Insight:  Developing/Improving and Engaged  Engagement in Therapy:  Developing/Improving and Engaged  Modes of Intervention:  Clarification, Confrontation, Discussion, Education, Exploration, Limit-setting, Orientation, Problem-solving, Rapport Building, Dance movement psychotherapisteality Testing, Socialization and Support  Summary of Progress/Problems: The topic for group was balance in life.  Today's group focused on defining balance in one's own words, identifying things that can knock one off balance, and exploring healthy ways to maintain balance in life. Group members were asked to provide an example of a time when they felt off balance, describe how they handled that situation,and process healthier ways to regain balance in the future. Group members were asked to share the most important tool for maintaining balance that they [facility] and how they plan to apply this method after discharge.  Pt shared that her meds have not been working and needs to figure them out so she can function again and restore balance in her life.  Pt states that she wants to be able to go back to work and care for her daughters but can't when she is not stable.  Pt actively participated and was engaged in group discussion.    Michaela IvanChelsea Horton, LCSW 01/12/2014  2:34 PM

## 2014-01-12 NOTE — Progress Notes (Signed)
Patient ID: Isabella StallingKimberly C Coppa, female   DOB: May 21, 1976, 38 y.o.   MRN: [id number] D: pt. In bed eyes closed, respirations even. A: Writer observed pt. For s/s of distress. Staff will monitor q7315min for safety. R: Pt. Is safe on the unit, no distress noted, respirations unlabored.
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
## 2014-01-12 NOTE — [facility] Suicide Risk Assessment (Signed)
Suicide Risk Assessment  Admission Assessment     Nursing information obtained from:    Demographic factors:    Current Mental Status:    Loss Factors:    Historical Factors:    Risk Reduction Factors:    Total Time spent with patient: 45 minutes  CLINICAL FACTORS:   Severe Anxiety and/or Agitation Bipolar Disorder:   Depressive phase Postpartum Depression Chronic Pain Previous Psychiatric Diagnoses and Treatments Medical Diagnoses and Treatments/Surgeries  Psychiatric Specialty Exam:     Blood pressure 109/75, pulse 89, temperature 98.1 F (36.7 C), temperature source Oral, resp. rate 16, height 5\' 6"  (1.676 m), weight 69.4 kg (153 lb).Body mass index is 24.71 kg/(m^2).  General Appearance: Bizarre, Casual and Disheveled  Eye Contact::  Good  Speech:  Clear and Coherent and Slow  Volume:  Decreased  Mood:  Anxious, Depressed, Hopeless and Worthless  Affect:  Depressed and Flat  Thought Process:  Goal Directed and Intact  Orientation:  Full (Time, Place, and Person)  Thought Content:  Rumination  Suicidal Thoughts:  Yes.  with intent/plan  Homicidal Thoughts:  No  Memory:  Immediate;   Fair Recent;   Fair  Judgement:  Impaired  Insight:  Lacking  Psychomotor Activity:  Psychomotor Retardation and Restlessness  Concentration:  Fair  Recall:  [employer]  Fund of Knowledge:Fair  Language: Good  Akathisia:  NA  Handed:  Right  AIMS (if indicated):     Assets:  Communication Skills Desire for Improvement Financial Resources/Insurance Housing Intimacy Leisure Time Physical Health Resilience Social Support Talents/Skills Transportation  Sleep:      Musculoskeletal: Strength & Muscle Tone: within normal limits Gait & Station: normal Patient leans: N/A  COGNITIVE FEATURES THAT CONTRIBUTE TO RISK:  Polarized thinking    SUICIDE RISK:   Moderate:  Frequent suicidal ideation with limited intensity, and duration, some specificity in terms of plans, no associated  intent, good self-control, limited dysphoria/symptomatology, some risk factors present, and identifiable protective factors, including available and accessible social support.  PLAN OF CARE: Admit for crisis stabilization, safety monitoring and medication management of bipolar disorder with the depressive symptoms and panic symptoms with suicide behaviors and questionable noncompliant with medication/medications not working.  I certify that inpatient services furnished can reasonably be expected to improve the patient's condition.  Michaela [name] 01/12/2014, 3:15 PM

## 2014-01-13 DIAGNOSIS — F3130 Bipolar disorder, current episode depressed, mild or moderate severity, unspecified: Secondary | ICD-10-CM

## 2014-01-13 MED ORDER — ASPIRIN-ACETAMINOPHEN-CAFFEINE 250-250-65 MG PO TABS
2.0000 | ORAL_TABLET | Freq: Once | ORAL | Status: AC
  Administered 2014-01-13: 2 via ORAL
  Filled 2014-01-13 (×2): qty 2

## 2014-01-13 MED ORDER — TRAZODONE HCL 100 MG PO TABS
100.0000 mg | ORAL_TABLET | Freq: Every day | ORAL | Status: DC
Start: 2014-01-13 — End: 2014-01-16
  Administered 2014-01-13 – 2014-01-15 (×3): 100 mg via ORAL
  Filled 2014-01-13 (×5): qty 1

## 2014-01-13 NOTE — Tx Team (Signed)
Interdisciplinary Treatment Plan Update   Date Reviewed:  01/13/2014  Time Reviewed:  9:45 AM  Progress in Treatment:   Attending groups: Yes Participating in groups: Yes Taking medication as prescribed: Yes  Tolerating medication: Yes Family/Significant other contact made:  No, but will ask patient for consent for collateral contact Patient understands diagnosis: Yes  Discussing patient identified problems/goals with staff: Yes Medical problems stabilized or resolved: Yes Denies suicidal/homicidal ideation: Yes Patient has not harmed self or others: Yes  For review of initial/current patient goals, please see plan of care.  Estimated Length of Stay:  3-4 days  Reasons for Continued Hospitalization:  Anxiety Depression Medication stabilization   New Problems/Goals identified:    Discharge Plan or Barriers:   Home with outpatient follow up to be determined  Additional Comments:  Michaela [name] is a 38 years old Michaela [name] female who was recently Married on Valentine's Day and has a two young daughters 978 and 38 years old, 38 years old her daughter was sent to her dad's in [place] because she cannot manage her behaviors about 4 months ago. Patient was admitted voluntarily and emergently from her primary psychiatrist office with a increased symptoms of depression and suicide attempt followed by an argument with her husband regarding her mental illness. Patient has been diagnosed with bipolar disorder and has been receiving medication management over 7 years. Patient reported initially she was suffered with postpartum depression later she has been diagnosed with depression and anxiety and they see with multiple medication trials without significant improvements. 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  Attendees:  Patient:  01/13/2014 9:45 AM   Signature: Mervyn GayJ. Jonnalagadda, MD 01/13/2014 9:45 AM  Signature:  Verne SpurrNeil Mashburn, PA 01/13/2014 9:45 AM  Signature:  Harold Barbanonecia Byrd, RN 01/13/2014 9:45 AM  Signature:   01/13/2014 9:45 AM  Signature:   01/13/2014 9:45 AM  Signature:  Michaela PatchQuylle Lakeithia Rasor, LCSW 01/13/2014 9:45 AM  Signature:  Michaela Ivanhelsea Horton, LCSW 01/13/2014 9:45 AM  Signature:  Michaela [name], Care Coordinator [employer] 01/13/2014 9:45 AM  Signature:     01/13/2014 9:45 AM  Signature: Michaela ParodyBritney Tyson, RN 01/13/2014  9:45 AM  Signature:   Onnie BoerJennifer Clark, RN [facility] 01/13/2014  9:45 AM  Signature: 01/13/2014  9:45 AM    Scribe for Treatment Team:   Michaela PatchQuylle Kyandra Mcclaine,  01/13/2014 9:45 AM

## 2014-01-13 NOTE — [facility] Group Notes (Signed)
[facility] LCSW Group Therapy  Feelings Around Relapse 1:15 -2:30        01/13/2014   Type of Therapy:  Group Therapy  Participation Level:  Appropriate  Participation Quality:  Appropriate  Affect:  Appropriate  Cognitive:  Attentive Appropriate  Insight:  Developing/Improving  Engagement in Therapy: Developing/Improving  Modes of Intervention:  Discussion Exploration Problem-Solving Supportive  Summary of Progress/Problems:  The topic for today was feelings around relapse.    Patient processed feelings toward relapse and was able to relate to peers. She shared relapsing is staying in bed all day with the blinds closed.   Patient identified coping skills that can be used to prevent a relapse.   Wynn BankerHodnett, Laureen Frederic Hairston 01/13/2014

## 2014-01-13 NOTE — Progress Notes (Signed)
D: Pt denies SI/HI/AVH. Pt is pleasant and cooperative. Pt complained of migraines earlier, but did not mention them later in the evening.  A: Pt was offered support and encouragement. Pt was given scheduled medications. Pt was encourage to attend groups. Q 15 minute checks were done for safety.   R: Pt attends groups and interacts well with peers and staff. Pt is taking medication. Pt has no complaints at this time.Pt receptive to treatment and safety maintained on unit.

## 2014-01-13 NOTE — Progress Notes (Signed)
[facility] MD Progress Note  01/13/2014 12:51 PM Michaela [name]  MRN:  [id number] Subjective:  Met with patient in her room today to discuss her progress thus far. She is wondering why she is only getting Wellbutrin. She mentioned Latuda, Trazedone and Wellbutrin as what she wanted to be taking. She reports that she is having some tremors of her hands, but this provider did not witness any. MAR reveals that she did get Latuda this AM as well. She states she has been 100% compliant with groups and would like to be discharged as planned on Monday.  She states she would like to have Mother's day with her children on Monday since she has to be here on Sunday.  When told that she could have her children to come visit here, she states there are "crazy people here, some aren't wearing clothes." She goes on to say that she wouldn't want to expose her children to that.  Rates her depression as a 7/10, but her anxiety is always a 10/10 because her job is so very stressful. Diagnosis:   DSM5: Schizophrenia Disorders:   Obsessive-Compulsive Disorders:   Trauma-Stressor Disorders:   Substance/Addictive Disorders:   Depressive Disorders:   Total Time spent with patient: 20 minutes  Depressive Disorders:  AXIS I: Bipolar, Depressed  AXIS II: Deferred  AXIS III:  Past Medical History   Diagnosis  Date   .  Anxiety    .  Depression    .  Interstitial cystitis  2008     "from 1 to 10 I'm a 10"    AXIS IV: other psychosocial or environmental problems, problems related to social environment and problems with primary support group  AXIS V: 41-50 serious symptoms  ADL's:  Intact  Sleep: Good  Appetite:  Fair  Suicidal Ideation:  denies Homicidal Ideation:  denies AEB (as evidenced by):  Psychiatric Specialty Exam: Physical Exam  ROS  Blood pressure 109/78, pulse 102, temperature 97.4 F (36.3 C), temperature source Oral, resp. rate 16, height 5' 6"  (1.676 m), weight 69.4 kg (153 lb).Body mass index is  24.71 kg/(m^2).  General Appearance: Fairly Groomed  Engineer, water::  Good  Speech:  Clear and Coherent  Volume:  Normal  Mood:  Anxious  Affect:  Congruent  Thought Process:  Goal Directed  Orientation:  Full (Time, Place, and Person)  Thought Content:  WDL  Suicidal Thoughts:  No  Homicidal Thoughts:  No  Memory:  NA  Judgement:  Poor  Insight:  Shallow  Psychomotor Activity:  Normal  Concentration:  Fair  Recall:  [place]  Language: Good  Akathisia:  No  Handed:  Right  AIMS (if indicated):     Assets:  Communication Skills Desire for Improvement Financial Resources/Insurance Physical Health Social Support Talents/Skills Transportation Vocational/Educational  Sleep:  Number of Hours: 6   Musculoskeletal: Strength & Muscle Tone: within normal limits Gait & Station: normal Patient leans: N/A  Current Medications: Current Facility-Administered Medications  Medication Dose Route Frequency Provider Last Rate Last Dose  . acetaminophen (TYLENOL) tablet 650 mg  650 mg Oral Q6H PRN Laverle Hobby, PA-C   650 mg at 01/12/14 1709  . alum & mag hydroxide-simeth (MAALOX/MYLANTA) 200-200-20 MG/5ML suspension 30 mL  30 mL Oral Q4H PRN Laverle Hobby, PA-C      . buPROPion (WELLBUTRIN XL) 24 hr tablet 150 mg  150 mg Oral Daily Durward Parcel, MD   150 mg at 01/13/14 0756  .  diazepam (VALIUM) tablet 5 mg  5 mg Oral Q12H PRN Durward Parcel, MD   5 mg at 01/12/14 1709  . hydrOXYzine (ATARAX/VISTARIL) tablet 25 mg  25 mg Oral Q6H PRN Laverle Hobby, PA-C   25 mg at 01/13/14 0756  . lurasidone (LATUDA) tablet 40 mg  40 mg Oral Q breakfast Durward Parcel, MD   40 mg at 01/13/14 0756  . magnesium hydroxide (MILK OF MAGNESIA) suspension 30 mL  30 mL Oral Daily PRN Laverle Hobby, PA-C      . nicotine (NICODERM CQ - dosed in mg/24 hours) patch 14 mg  14 mg Transdermal Daily Durward Parcel, MD   14 mg at 01/12/14 0847  .  traZODone (DESYREL) tablet 50 mg  50 mg Oral QHS,MR X 1 Laverle Hobby, PA-C   50 mg at 01/12/14 2317    Lab Results:  Results for orders placed during the hospital encounter of 01/11/14 (from the past 48 hour(s))  CBC     Status: Abnormal   Collection Time    01/11/14  6:00 PM      Result Value Ref Range   WBC 10.9 (*) 4.0 - 10.5 K/uL   RBC 4.17  3.87 - 5.11 MIL/uL   Hemoglobin 13.5  12.0 - 15.0 g/dL   HCT 41.4  36.0 - 46.0 %   MCV 99.3  78.0 - 100.0 fL   MCH 32.4  26.0 - 34.0 pg   MCHC 32.6  30.0 - 36.0 g/dL   RDW 13.9  11.5 - 15.5 %   Platelets 488 (*) 150 - 400 K/uL  BASIC METABOLIC PANEL     Status: Abnormal   Collection Time    01/11/14  6:00 PM      Result Value Ref Range   Sodium 143  137 - 147 mEq/L   Potassium 3.7  3.7 - 5.3 mEq/L   Chloride 106  96 - 112 mEq/L   CO2 24  19 - 32 mEq/L   Glucose, Bld 96  70 - 99 mg/dL   BUN 6  6 - 23 mg/dL   Creatinine, Ser 0.86  0.50 - 1.10 mg/dL   Calcium 9.8  8.4 - 10.5 mg/dL   GFR calc non Af Amer 85 (*) >90 mL/min   GFR calc Af Amer >90  >90 mL/min   Comment: (NOTE)     The eGFR has been calculated using the CKD EPI equation.     This calculation has not been validated in all clinical situations.     eGFR's persistently <90 mL/min signify possible Chronic Kidney     Disease.  ETHANOL     Status: None   Collection Time    01/11/14  6:00 PM      Result Value Ref Range   Alcohol, Ethyl (B) <11  0 - 11 mg/dL   Comment:            LOWEST DETECTABLE LIMIT FOR     SERUM ALCOHOL IS 11 mg/dL     FOR MEDICAL PURPOSES ONLY  URINE RAPID DRUG SCREEN (HOSP PERFORMED)     Status: None   Collection Time    01/11/14  6:42 PM      Result Value Ref Range   Opiates NONE DETECTED  NONE DETECTED   Cocaine NONE DETECTED  NONE DETECTED   Benzodiazepines NONE DETECTED  NONE DETECTED   Amphetamines NONE DETECTED  NONE DETECTED   Tetrahydrocannabinol NONE DETECTED  NONE DETECTED  Barbiturates NONE DETECTED  NONE DETECTED   Comment:             DRUG SCREEN FOR MEDICAL PURPOSES     ONLY.  IF CONFIRMATION IS NEEDED     FOR ANY PURPOSE, NOTIFY LAB     WITHIN 5 DAYS.                LOWEST DETECTABLE LIMITS     FOR URINE DRUG SCREEN     Drug Class       Cutoff (ng/mL)     Amphetamine      1000     Barbiturate      200     Benzodiazepine   638     Tricyclics       466     Opiates          300     Cocaine          300     THC              50  PREGNANCY, URINE     Status: None   Collection Time    01/11/14  6:42 PM      Result Value Ref Range   Preg Test, Ur NEGATIVE  NEGATIVE   Comment:            THE SENSITIVITY OF THIS     METHODOLOGY IS >20 mIU/mL.  LITHIUM LEVEL     Status: Abnormal   Collection Time    01/11/14  6:57 PM      Result Value Ref Range   Lithium Lvl 0.63 (*) 0.80 - 1.40 mEq/L    Physical Findings: AIMS: Facial and Oral Movements Muscles of Facial Expression: None, normal Lips and Perioral Area: None, normal Jaw: None, normal Tongue: None, normal,Extremity Movements Upper (arms, wrists, hands, fingers): None, normal Lower (legs, knees, ankles, toes): None, normal, Trunk Movements Neck, shoulders, hips: None, normal, Overall Severity Severity of abnormal movements (highest score from questions above): None, normal Incapacitation due to abnormal movements: None, normal Patient's awareness of abnormal movements (rate only patient's report): No Awareness, Dental Status Current problems with teeth and/or dentures?: No Does patient usually wear dentures?: No  CIWA:  CIWA-Ar Total: 1 COWS:  COWS Total Score: 2  Treatment Plan Summary: Daily contact with patient to assess and evaluate symptoms and progress in treatment Medication management  Plan: 1. Continue crisis management and stabilization. 2. Continue medication management by assessing side effects, dose modification as needed and therapeutic blood levels as indicated. 3. Treat health problems as indicated or consult IM as needed. 4.  Continue treatment plan to decrease risk of relapse upon discharge and to reduce the     need for readmission to incorporate the use of local resources for support. 5. Psycho-social education regarding relapse prevention through good self care to incorporate diet, exercise, stress reduction, good sleep hygiene, and reduction in external risk factors such as alcohol, tobacco, and substance abuse. 6. Gain consent for contact with family, PCP, or outside health provider as needed. 7. Review home medications as needed. 8. Disposition in progress   Medical Decision Making Problem Points:  Established problem, stable/improving (1) Data Points:  Review of medication regiment & side effects (2)  I certify that inpatient services furnished can reasonably be expected to improve the patient's condition.  Marlane Hatcher. Mashburn RPAC 3:56 PM 01/13/2014  Reviewed the information documented and agree with the treatment plan.  Parke Simmers Larysa Pall 01/14/2014  2:16 PM

## 2014-01-13 NOTE — [facility] Group Notes (Signed)
[facility]   01/13/2014 1:11 PM    Participation Quality:  Appropraite  Mood/Affect:  Appropriate  Depression Rating:  7  Anxiety Rating:  5  Thoughts of Suicide:  No  Will you contract for safety?   NA  Current AVH:  No  Plan for Discharge/Comments:  Patient attended discharge planning group and actively participated in group.  She is followed outpatient by Dr. Evelene CroonKaur.  CSW provided all participants with daily workbook.   Transportation Means: Patient has transportation.   Supports:  Patient has a support system.   Kalee Mcclenathan, Joesph JulyQuylle Hairston

## 2014-01-13 NOTE — Progress Notes (Signed)
Adult Psychoeducational Group Note  Date:  01/12/2014 Time:  10:00am Group Topic/Focus:  Making Healthy Choices:   The focus of this group is to help patients identify negative/unhealthy choices they were using prior to admission and identify positive/healthier coping strategies to replace them upon discharge.  Participation Level:  Active  Participation Quality:  Appropriate and Attentive  Affect:  Appropriate  Cognitive:  Alert and Appropriate  Insight: Appropriate  Engagement in Group:  Engaged  Modes of Intervention:  Discussion and Education  Additional Comments:  Pt attended and participated in group. Discussion was on Leisure Lifestyle and making healthy choices. The question was asked What is one thing you can do to make a positive change in your life? Pt stated to l;eave the house more and become more active.   Pryor Curiaanya D Garner 01/13/2014, 3:39 AM

## 2014-01-13 NOTE — Progress Notes (Signed)
Writer spoke with patient 1:1 and she reports her day as being "so-so". She reports that she feels that her medications are not correct just yet. Writer encouraged her to be patient to see how the medications work for her and she was concerned that she will be here on mother's day and not able to spend it with her children. She does not want her kids visiting her while she is here. She attended group this evening and has been in the dayroom interacting with peers. She denies si/hi/a/v hallucinations, safety maintained on unit with 15 min checks.

## 2014-01-13 NOTE — Progress Notes (Signed)
D: Patient appropriate and cooperative with staff and peers. Patient has anxious affect and mood. She reported on the self inventory sheet that she's sleeping well, appetite is good, energy level is normal and ability to pay attention is improving. Patient rates depression "6" and feelings of hopelessness "5". She's participating in groups, interactive with peers in the dayroom and visible in the milieu. Patient adheres to current medication regimen.  A: Support and encouragement provided to patient. Administered scheduled medications per ordering MD. Monitor Q15 minute checks for safety.  R: Patient receptive. Denies SI/HI and AVH. Patient remains safe on the unit.

## 2014-01-13 NOTE — Progress Notes (Signed)
[facility] Group Notes:  (Nursing/MHT/Case Management/Adjunct)  Date:  01/13/2014  Time:  9:21 PM  Type of Therapy:  Group Therapy  Participation Level:  Active  Participation Quality:  Appropriate  Affect:  Appropriate  Cognitive:  Appropriate  Insight:  Appropriate  Engagement in Group:  Engaged  Modes of Intervention:  Socialization and Support  Summary of Progress/Problems: Pt. Participated in the group discussion on stages of change.  Pt. Stated she would like to make a change to be more active by getting up and being productive.  [name] 01/13/2014, 9:21 PM

## 2014-01-14 DIAGNOSIS — F3110 Bipolar disorder, current episode manic without psychotic features, unspecified: Secondary | ICD-10-CM

## 2014-01-14 MED ORDER — LURASIDONE HCL 80 MG PO TABS
80.0000 mg | ORAL_TABLET | Freq: Every day | ORAL | Status: DC
  Administered 2014-01-15 – 2014-01-16 (×2): 80 mg via ORAL
  Filled 2014-01-14 (×4): qty 1

## 2014-01-14 NOTE — Progress Notes (Signed)
[facility]  01/14/2014 2:09 PM Michaela [name]  MRN:  [id number] Subjective: Patient was admitted voluntarily and emergently from her primary psychiatrist office with a increased symptoms of depression and suicide attempt followed by an argument with her husband regarding her mental illness. Patient has been diagnosed with bipolar disorder and has been receiving medication management over 7 years. Patient rated her symptoms of depression 6/10 and anxiety 9/10 and disturbance of sleep and has no problem with appetite. Patient has no reported behavioral problems in getting along with peers and staff on the unit.   Diagnosis:   DSM5: Schizophrenia Disorders:   Obsessive-Compulsive Disorders:   Trauma-Stressor Disorders:   Substance/Addictive Disorders:   Depressive Disorders:   Total Time spent with patient: 30 minutes  Axis I: Bipolar, Manic  ADL's:  Impaired  Sleep: Fair  Appetite:  Fair  Suicidal Ideation:  Patient endorses suicidal ideation but contracts for safety while in the hospital Homicidal Ideation:  Denied AEB (as evidenced by):  Psychiatric Specialty Exam: Physical Exam  ROS  Blood pressure 112/78, pulse 109, temperature 98.1 F (36.7 C), temperature source Oral, resp. rate 17, height 5\' 6"  (1.676 m), weight 69.4 kg (153 lb).Body mass index is 24.71 kg/(m^2).  General Appearance: Casual  Eye Contact::  Good  Speech:  Clear and Coherent  Volume:  Normal  Mood:  Anxious and Depressed  Affect:  Appropriate and Congruent  Thought Process:  Coherent and Goal Directed  Orientation:  Full (Time, Place, and Person)  Thought Content:  WDL  Suicidal Thoughts:  Yes.  without intent/plan  Homicidal Thoughts:  No  Memory:  Immediate;   Fair  Judgement:  Fair  Insight:  Fair  Psychomotor Activity:  Decreased  Concentration:  Good  Recall:  Good  Fund of Knowledge:Good  Language: Good  Akathisia:  NA  Handed:  Right  AIMS (if indicated):     Assets:   Communication Skills Desire for Improvement Financial Resources/Insurance Housing Intimacy Leisure Time Physical Health Resilience Social Support Talents/Skills Transportation  Sleep:  Number of Hours: 6   Musculoskeletal: Strength & Muscle Tone: within normal limits Gait & Station: normal Patient leans: N/A  Current Medications: Current Facility-Administered Medications  Medication Dose Route Frequency Provider Last Rate Last Dose  . acetaminophen (TYLENOL) tablet 650 mg  650 mg Oral Q6H PRN Kerry HoughSpencer E Simon, PA-C   650 mg at 01/12/14 1709  . alum & mag hydroxide-simeth (MAALOX/MYLANTA) 200-200-20 MG/5ML suspension 30 mL  30 mL Oral Q4H PRN Kerry HoughSpencer E Simon, PA-C      . buPROPion (WELLBUTRIN XL) 24 hr tablet 150 mg  150 mg Oral Daily Nehemiah SettleJanardhaha R Lalana Wachter, MD   150 mg at 01/14/14 0750  . diazepam (VALIUM) tablet 5 mg  5 mg Oral Q12H PRN Nehemiah SettleJanardhaha R Takenya Travaglini, MD   5 mg at 01/13/14 2112  . hydrOXYzine (ATARAX/VISTARIL) tablet 25 mg  25 mg Oral Q6H PRN Kerry HoughSpencer E Simon, PA-C   25 mg at 01/14/14 0753  . lurasidone (LATUDA) tablet 40 mg  40 mg Oral Q breakfast Nehemiah SettleJanardhaha R Ayodele Hartsock, MD   40 mg at 01/14/14 0750  . magnesium hydroxide (MILK OF MAGNESIA) suspension 30 mL  30 mL Oral Daily PRN Kerry HoughSpencer E Simon, PA-C      . traZODone (DESYREL) tablet 100 mg  100 mg Oral QHS Verne SpurrNeil Mashburn, PA-C   100 mg at 01/13/14 2112    Lab Results: No results found for this or any previous visit (from the past  48 hour(s)).  Physical Findings: AIMS: Facial and Oral Movements Muscles of Facial Expression: None, normal Lips and Perioral Area: None, normal Jaw: None, normal Tongue: None, normal,Extremity Movements Upper (arms, wrists, hands, fingers): None, normal Lower (legs, knees, ankles, toes): None, normal, Trunk Movements Neck, shoulders, hips: None, normal, Overall Severity Severity of abnormal movements (highest score from questions above): None, normal Incapacitation due to abnormal  movements: None, normal Patient's awareness of abnormal movements (rate only patient's report): No Awareness, Dental Status Current problems with teeth and/or dentures?: No Does patient usually wear dentures?: No  CIWA:  CIWA-Ar Total: 1 COWS:  COWS Total Score: 2  Treatment Plan Summary: Daily contact with patient to assess and evaluate symptoms and progress in treatment Medication management  Plan: Treatment Plan/Recommendations:   1. Admit for crisis management and stabilization. 2. Medication management to reduce current symptoms to base line and improve the patient's overall level of functioning. Increase Latuda 80 mg Po Qam with breakfast Continue Wellbutrin XL 150 mg daily  Continue trazodone 100 mg at bedtime  Continue Valium 5 mg as needed for anxiety 3. Treat health problems as indicated. 4. Develop treatment plan to decrease risk of relapse upon discharge and to reduce the need for readmission. 5. Psycho-social education regarding relapse prevention and self care. 6. Health care follow up as needed for medical problems. 7. Restart home medications where appropriate.   Medical Decision Making Problem Points:  Established problem, worsening (2), New problem, with no additional work-up planned (3), Review of last therapy session (1) and Review of psycho-social stressors (1) Data Points:  Review or order clinical lab tests (1) Review or order medicine tests (1) Review of medication regiment & side effects (2) Review of new medications or change in dosage (2)  I certify that inpatient services furnished can reasonably be expected to improve the patient's condition.   Randal BubaJanardhaha R Kiya Eno 01/14/2014, 2:09 PM

## 2014-01-14 NOTE — [facility] Group Notes (Signed)
0900 nursing orientation group   The focus of this group is to educate the patient on the purpose and policies of crisis stabilization and provide a format to answer questions about their admission.  The group details unit policies and expectations of patients while admitted.   Pt was an active participant and was appropriate. Her goal today "work through my anxiety today" She feels happiest when"I am spending time with my daughters"

## 2014-01-14 NOTE — Progress Notes (Signed)
Adult Psychoeducational Group Note  Date:  01/14/2014 Time:  12:03 PM  Group Topic/Focus:  Therapeutic Activity   Participation Level:  Minimal  Participation Quality:  Attentive and Resistant  Affect:  Appropriate  Cognitive:  Oriented  Insight: Good  Engagement in Group:  Lacking  Modes of Intervention:  Activity, Discussion, Socialization and Support  Additional Comments:  Pt stated one positive experience was karaoke and meeting new people that she can relate to.   Elijio MilesBrittany N Gaylord Seydel 01/14/2014, 12:03 PM

## 2014-01-14 NOTE — Progress Notes (Signed)
Patient has been up and active on the unit, observed laughing and talking with peers and has voiced no complaints. Patient currently denies having pain, -si/hi/a/v hall. She reports that her anxiety has been under control much better today. Support and encouragement offered, safety maintained on unit, will continue to monitor.

## 2014-01-14 NOTE — [facility] Group Notes (Signed)
[facility] LCSW Group Therapy Note  01/14/2014 /1:15 PM  Type of Therapy and Topic:  Group Therapy: Avoiding Self-Sabotaging and Enabling Behaviors  Participation Level:  Engaged  Mood: Flat  Description of Group:     Learn how to identify obstacles, self-sabotaging and enabling behaviors, what are they, why do we do them and what needs do these behaviors meet? Discuss unhealthy relationships and how to have positive healthy boundaries with those that sabotage and enable. Explore aspects of self-sabotage and enabling in yourself and how to limit these self-destructive behaviors in everyday life.  Therapeutic Goals: 1. Patient will identify one obstacle that relates to self-sabotage and enabling behaviors 2. Patient will identify one personal self-sabotaging or enabling behavior they did prior to admission 3. Patient able to establish a plan to change the above identified behavior they did prior to admission:  4. Patient will demonstrate ability to communicate their needs through discussion and/or role plays.   Summary of Patient Progress: The main focus of today's process group was to discuss what "self-sabotage" means and use Motivational Interviewing to discuss what benefits, negative or positive, were involved in a self-identified self-sabotaging behavior. We then talked about the stages of change model and patient were given opportunity to identify where they are currently in stages of change and what self sabotaging behaviors they feel they need to be on lookout for. Pt reports she is looking forward to going on a cruise this year and feels she is developing an action plan while knowing isolation is her biggest maladaptive coping skill. She shared pattern over last month of getting kids to school and returning home to bed and shared "anything will be an improvement."    Therapeutic Modalities:   Cognitive Behavioral Therapy Person-Centered Therapy Motivational Interviewing   Michaela [name]  Michaela Matty, LCSW

## 2014-01-14 NOTE — Progress Notes (Signed)
[facility] Group Notes:  (Nursing/MHT/Case Management/Adjunct)  Date:  01/14/2014  Time:  11:34 PM  Type of Therapy:  Group Therapy  Participation Level:  Active  Participation Quality:  Appropriate  Affect:  Appropriate  Cognitive:  Appropriate  Insight:  Appropriate  Engagement in Group:  Engaged  Modes of Intervention:  Socialization and Support  Summary of Progress/Problems: Pt. Participated in group discussion on healthy coping skills.  Pt. Stated an early warning sign isolation, and a healthy coping skill is communication with a close friend.   Sondra ComeRyan J Jimmye Wisnieski 01/14/2014, 11:34 PM

## 2014-01-14 NOTE — Progress Notes (Addendum)
Pt appears in good spirits in the dayroom conversing with the other pts. She rates her depression a 6/10 and her anxiety a 4/10. Pt has been having intermittent headaches but has denied having a headache today. Once discharged she would like to become more active and go outdoors more. Pt denies Si or HI and contracts for safety.4:15pm_pt did go outdoors with the other pts.

## 2014-01-15 NOTE — Progress Notes (Signed)
Patient ID: Michaela [name], female   DOB: 08-09-1976, 38 y.o.   MRN: [id number] [facility]  01/15/2014 11:17 AM Michaela [name]  MRN:  [id number] Subjective: Patient was admitted voluntarily and emergently from her primary psychiatrist office with a increased symptoms of depression and suicide attempt followed by an argument with her husband regarding her mental illness. Patient has been diagnosed with bipolar disorder and has been receiving medication management over 7 years. Patient rated her symptoms of depression 6/10 and anxiety 9/10 and disturbance of sleep and has no problem with appetite. Patient has no reported behavioral problems in getting along with peers and staff on the unit.   Patient has been compliant with her medication management in the unit activities including therapeutic groups. Patient stated she has been feeling better and stated  She husband seems to understand her mental illness and supportive to her. Acute or has no reported adverse effect of the medication. Patient has minimized symptoms of depression and anxiety during this weekend patient is hoping to be discharged home on Monday. Patient has no reported behavioral problems including irritability, agitation and aggressive behaviors during this weekend.   Diagnosis:   DSM5: Schizophrenia Disorders:   Obsessive-Compulsive Disorders:   Trauma-Stressor Disorders:   Substance/Addictive Disorders:   Depressive Disorders:   Total Time spent with patient: 30 minutes  Axis I: Bipolar, Manic  ADL's:  Impaired  Sleep: Fair  Appetite:  Fair  Suicidal Ideation:  Patient endorses suicidal ideation but contracts for safety while in the hospital Homicidal Ideation:  Denied AEB (as evidenced by):  Psychiatric Specialty Exam: Physical Exam  ROS  Blood pressure 113/78, pulse 101, temperature 97.5 F (36.4 C), temperature source Oral, resp. rate 16, height 5\' 6"  (1.676 m), weight 69.4 kg (153 lb).Body mass  index is 24.71 kg/(m^2).  General Appearance: Casual  Eye Contact::  Good  Speech:  Clear and Coherent  Volume:  Normal  Mood:  Anxious and Depressed  Affect:  Appropriate and Congruent  Thought Process:  Coherent and Goal Directed  Orientation:  Full (Time, Place, and Person)  Thought Content:  WDL  Suicidal Thoughts:  Yes.  without intent/plan  Homicidal Thoughts:  No  Memory:  Immediate;   Fair  Judgement:  Fair  Insight:  Fair  Psychomotor Activity:  Decreased  Concentration:  Good  Recall:  Good  Fund of Knowledge:Good  Language: Good  Akathisia:  NA  Handed:  Right  AIMS (if indicated):     Assets:  Communication Skills Desire for Improvement Financial Resources/Insurance Housing Intimacy Leisure Time Physical Health Resilience Social Support Talents/Skills Transportation  Sleep:  Number of Hours: 6.75   Musculoskeletal: Strength & Muscle Tone: within normal limits Gait & Station: normal Patient leans: N/A  Current Medications: Current Facility-Administered Medications  Medication Dose Route Frequency Provider Last Rate Last Dose  . acetaminophen (TYLENOL) tablet 650 mg  650 mg Oral Q6H PRN Kerry HoughSpencer E Simon, PA-C   650 mg at 01/12/14 1709  . alum & mag hydroxide-simeth (MAALOX/MYLANTA) 200-200-20 MG/5ML suspension 30 mL  30 mL Oral Q4H PRN Kerry HoughSpencer E Simon, PA-C      . buPROPion (WELLBUTRIN XL) 24 hr tablet 150 mg  150 mg Oral Daily Nehemiah SettleJanardhaha R Oretta Berkland, MD   150 mg at 01/15/14 0750  . diazepam (VALIUM) tablet 5 mg  5 mg Oral Q12H PRN Nehemiah SettleJanardhaha R Mycal Conde, MD   5 mg at 01/14/14 2124  . hydrOXYzine (ATARAX/VISTARIL) tablet 25 mg  25 mg Oral  Q6H PRN Kerry HoughSpencer E Simon, PA-C   25 mg at 01/15/14 0841  . lurasidone (LATUDA) tablet 80 mg  80 mg Oral Q breakfast Nehemiah SettleJanardhaha R Dinia Joynt, MD   80 mg at 01/15/14 0750  . magnesium hydroxide (MILK OF MAGNESIA) suspension 30 mL  30 mL Oral Daily PRN Kerry HoughSpencer E Simon, PA-C      . traZODone (DESYREL) tablet 100 mg  100  mg Oral QHS Verne SpurrNeil Mashburn, PA-C   100 mg at 01/14/14 2122    Lab Results: No results found for this or any previous visit (from the past 48 hour(s)).  Physical Findings: AIMS: Facial and Oral Movements Muscles of Facial Expression: None, normal Lips and Perioral Area: None, normal Jaw: None, normal Tongue: None, normal,Extremity Movements Upper (arms, wrists, hands, fingers): None, normal Lower (legs, knees, ankles, toes): None, normal, Trunk Movements Neck, shoulders, hips: None, normal, Overall Severity Severity of abnormal movements (highest score from questions above): None, normal Incapacitation due to abnormal movements: None, normal Patient's awareness of abnormal movements (rate only patient's report): No Awareness, Dental Status Current problems with teeth and/or dentures?: No Does patient usually wear dentures?: No  CIWA:  CIWA-Ar Total: 1 COWS:  COWS Total Score: 2  Treatment Plan Summary: Daily contact with patient to assess and evaluate symptoms and progress in treatment Medication management  Plan: Treatment Plan/Recommendations:   1. Admit for crisis management and stabilization. 2. Medication management to reduce current symptoms to base line and improve the patient's overall level of functioning.  continue  Latuda 80 mg Po Qam with breakfast Continue Wellbutrin XL 150 mg daily  Continue trazodone 100 mg at bedtime  Continue Valium 5 mg as needed for anxiety 3. Treat health problems as indicated. 4. Develop treatment plan to decrease risk of relapse upon discharge and to reduce the need for readmission. 5. Psycho-social education regarding relapse prevention and self care. 6. Health care follow up as needed for medical problems. 7. Restart home medications where appropriate. 8. Disposition plans are in progress patient may be discharged home when she is clinically stable and contracts for safety. Patient wishes to go home on Monday and follow up with outpatient  care with a primary psychiatrist.   Medical Decision Making Problem Points:  Established problem, worsening (2), New problem, with no additional work-up planned (3), Review of last therapy session (1) and Review of psycho-social stressors (1) Data Points:  Review or order clinical lab tests (1) Review or order medicine tests (1) Review of medication regiment & side effects (2) Review of new medications or change in dosage (2)  I certify that inpatient services furnished can reasonably be expected to improve the patient's condition.   Randal BubaJanardhaha R Adelayde Minney 01/15/2014, 11:17 AM

## 2014-01-15 NOTE — [facility] Group Notes (Signed)
[facility] Group Notes:  (Nursing/MHT/Case Management/Adjunct)  Date:  01/15/2014  Time:  2:21 PM  Type of Therapy:  Psychoeducational Skills  Participation Level:  Active  Participation Quality:  Appropriate  Affect:  Appropriate  Cognitive:  Appropriate  Insight:  Appropriate  Engagement in Group:  Engaged  Modes of Intervention:  Discussion  Summary of Progress/Problems: Pt did attend self inventory group, pt reported that she was negative SI/HI, no AH/VH noted. Pt rated her depression as a 1, and her helplessness/hopelessness as a 0.       Jessikah Dicker Shanta Jarelyn Bambach 01/15/2014, 2:21 PM

## 2014-01-15 NOTE — Progress Notes (Signed)
Adult Psychoeducational Group Note  Date:  01/15/2014 Time:  9:05 PM  Group Topic/Focus:  Wrap-Up Group:   The focus of this group is to help patients review their daily goal of treatment and discuss progress on daily workbooks.  Participation Level:  Active  Participation Quality:  Appropriate  Affect:  Appropriate  Cognitive:  Appropriate  Insight: Appropriate  Engagement in Group:  Engaged  Modes of Intervention:  Discussion  Additional Comments: The patient expressed that she had a good day.The patient said that she will be discharged and that made her day so good.  Laneta Simmersrthur L Britain Anagnos 01/15/2014, 9:05 PM

## 2014-01-15 NOTE — Progress Notes (Signed)
Pt has been up and active in the milieu today. She denied any depression or hopelessness but rated her anxiety a 4 on her self-inventory. She requested vistaril at 0841 for her anxiety which was helpful. She denies any S/H ideation or A/V/H.  She plans to see Dr. Evelene CroonKaur at discharge.

## 2014-01-15 NOTE — Progress Notes (Signed)
Patient has been up in the dayroom interacting appropriately with peers.She plans to attend group this evening and has voiced no complaints. She is looking forward to discharge on tomorrow and feels that her medications are working for her. Patient currently denies having pain, -si/hi/a/v hall. Support and encouragement offered, safety maintained on unit, will continue to monitor.

## 2014-01-16 DIAGNOSIS — F410 Panic disorder [episodic paroxysmal anxiety] without agoraphobia: Secondary | ICD-10-CM

## 2014-01-16 DIAGNOSIS — F411 Generalized anxiety disorder: Secondary | ICD-10-CM | POA: Diagnosis present

## 2014-01-16 DIAGNOSIS — F319 Bipolar disorder, unspecified: Secondary | ICD-10-CM

## 2014-01-16 MED ORDER — TRAZODONE HCL 100 MG PO TABS: 100.0000 mg | ORAL_TABLET | Freq: Every day | ORAL | Status: DC

## 2014-01-16 MED ORDER — LURASIDONE HCL 80 MG PO TABS: 80.0000 mg | ORAL_TABLET | Freq: Every day | ORAL | Status: DC

## 2014-01-16 MED ORDER — HYDROXYZINE HCL 25 MG PO TABS: 25.0000 mg | ORAL_TABLET | Freq: Four times a day (QID) | ORAL | Status: DC | PRN

## 2014-01-16 MED ORDER — DIAZEPAM 5 MG PO TABS: 5.0000 mg | ORAL_TABLET | Freq: Two times a day (BID) | ORAL | Status: DC | PRN

## 2014-01-16 MED ORDER — BUPROPION HCL ER (XL) 150 MG PO TB24: 150.0000 mg | ORAL_TABLET | Freq: Every day | ORAL | Status: DC

## 2014-01-16 NOTE — [facility] Group Notes (Signed)
[facility] LCSW Group Therapy  01/16/2014   1:15 PM   Type of Therapy:  Group Therapy  Participation Level:  Active  Participation Quality:  Attentive, Sharing and Supportive  Affect:  Calm  Cognitive:  Alert and Oriented  Insight:  Developing/Improving and Engaged  Engagement in Therapy:  Developing/Improving and Engaged  Modes of Intervention:  Clarification, Confrontation, Discussion, Education, Exploration, Limit-setting, Orientation, Problem-solving, Rapport Building, Dance movement psychotherapisteality Testing, Socialization and Support  Summary of Progress/Problems: Pt identified obstacles faced currently and processed barriers involved in overcoming these obstacles. Pt identified steps necessary for overcoming these obstacles and explored motivation (internal and external) for facing these difficulties head on. Pt further identified one area of concern in their lives and chose a goal to focus on for today.  Pt shared that her biggest obstacle is getting out of the house and getting active again, as she has been isolating due to her anxiety.  Pt states that the medication has been helpful but also has to utilize the coping skills she's learned here, as her anxiety continues to exist.  Pt actively participated and was engaged in group discussion.    Lovey Crupi Horton, LCSW 01/16/2014  3:00 PM

## 2014-01-16 NOTE — Progress Notes (Signed)
[facility] :  Will you be returning to the same living situation after discharge: Yes,  returning home At discharge, do you have transportation home?:Yes,  husband will pick pt up Do you have the ability to pay for your medications:Yes,  provided pt with prescriptions and pt verbalizes ability to afford meds  Release of information consent forms completed and in the chart;  Patient's signature needed at discharge.  Patient to Follow up at: Follow-up Information   [facility]. (Appointment scheduled at 11:15 am on this date with Dr. Evelene CroonKaur for medication management. )    Contact information:   [address] [place], [place] [address] Phone: [phone] Fax: [phone]      Patient denies SI/HI:   Yes,  denies SI/HI    Safety Planning and Suicide Prevention discussed:  Yes,  discussed with pt and pt's husband.  See suicide prevention education note.   Stacie Knutzen N Horton 01/16/2014, 10:18 AM

## 2014-01-16 NOTE — [facility] Suicide Risk Assessment (Signed)
Suicide Risk Assessment  Discharge Assessment     Demographic Factors:  Caucasian  Total Time spent with patient: 45 minutes  Psychiatric Specialty Exam:     Blood pressure 105/76, pulse 111, temperature 97.8 F (36.6 C), temperature source Oral, resp. rate 18, height 5\' 6"  (1.676 m), weight 69.4 kg (153 lb).Body mass index is 24.71 kg/(m^2).  General Appearance: Fairly Groomed  Patent attorneyye Contact::  Fair  Speech:  Clear and Coherent  Volume:  Normal  Mood:  worried  Affect:  worried  Thought Process:  Coherent and Goal Directed  Orientation:  Full (Time, Place, and Person)  Thought Content:  concerns about her anxiety when she goes back to work without the diazepam  Suicidal Thoughts:  No  Homicidal Thoughts:  No  Memory:  Immediate;   Fair Recent;   Fair Remote;   Fair  Judgement:  Fair  Insight:  Present  Psychomotor Activity:  Restlessness  Concentration:  Fair  Recall:  FiservFair  Fund of Knowledge:NA  Language: Fair  Akathisia:  No  Handed:    AIMS (if indicated):     Assets:  Desire for Improvement Housing Social Support Vocational/Educational  Sleep:  Number of Hours: 6.75    Musculoskeletal: Strength & Muscle Tone: within normal limits Gait & Station: normal Patient leans: N/A   Mental Status Per Nursing Assessment::   On Admission:     Current Mental Status by Physician: In full contact with reality. No active SI, plans or intent. Worried about when she has to go back to work and not have the diazepam. She will be willing to consider other alternatives to the diazepam that she is going to discuss with Dr. Evelene CroonKaur   Loss Factors: NA  Historical Factors: NA  Risk Reduction Factors:   Responsible for children under 38 years of age, Sense of responsibility to family, Employed, Living with another person, especially a relative and Positive social support  Continued Clinical Symptoms:  Severe Anxiety and/or Agitation Bipolar Disorder:   Bipolar II  Cognitive  Features That Contribute To Risk:  Closed-mindedness Polarized thinking Thought constriction (tunnel vision)    Suicide Risk:  Minimal: No identifiable suicidal ideation.  Patients presenting with no risk factors but with morbid ruminations; may be classified as minimal risk based on the severity of the depressive symptoms  Discharge Diagnoses:   AXIS I:  Bipolar Disorder, GAD, Panic Attacks AXIS II:  No diagnosis AXIS III:   Past Medical History  Diagnosis Date  . Anxiety   . Depression   . Interstitial cystitis 2008    "from 0 to 10 I'm a 10"   AXIS IV:  other psychosocial or environmental problems AXIS V:  61-70 mild symptoms  Plan Of Care/Follow-up recommendations:  Activity:  as tolerated Diet:  regular Consider other alternatives to Diazepam like gabapentin, buspirone, EMDR Follow up outpatient basis Dr. Evelene CroonKaur Is patient on multiple antipsychotic therapies at discharge:  No   Has Patient had three or more failed trials of antipsychotic monotherapy by history:  No  Recommended Plan for Multiple Antipsychotic Therapies: NA    Rachael FeeIrving A Legaci Tarman 01/16/2014, 1:59 PM

## 2014-01-16 NOTE — Tx Team (Signed)
Interdisciplinary Treatment Plan Update (Adult)  Date: 01/16/2014  Time Reviewed:  9:45 AM  Progress in Treatment: Attending groups: Yes Participating in groups:  Yes Taking medication as prescribed:  Yes Tolerating medication:  Yes Family/Significant othe contact made: Yes, with pt's husband Patient understands diagnosis:  Yes Discussing patient identified problems/goals with staff:  Yes Medical problems stabilized or resolved:  Yes Denies suicidal/homicidal ideation: Yes Issues/concerns per patient self-inventory:  Yes Other:  New problem(s) identified: N/A  Discharge Plan or Barriers: [facility].    Reason for Continuation of Hospitalization: Stable to d/c today  Comments: N/A  Estimated length of stay: D/C today  For review of initial/current patient goals, please see plan of care.  Attendees: Patient:  Michaela [name]  01/16/2014 10:14 AM   Family:     Physician:    Nursing:   Marzetta Boardhrista Dopson, RN 01/16/2014 10:14 AM   Clinical Social Worker:  Reyes Ivanhelsea Horton, LCSW 01/16/2014 10:14 AM   Other: Verne SpurrNeil Mashburn, PA 01/16/2014 10:14 AM   Other:  Quintella ReichertBeverly Knight, RN 01/16/2014 10:14 AM   Other:  Juline PatchQuylle Hodnett, LCSW 01/16/2014 10:14 AM   Other:  Onnie BoerJennifer Clark, case manager 01/16/2014 10:15 AM   Other:    Other:    Other:    Other:    Other:      Scribe for Treatment Team:   Carmina MillerHorton, Coltin Casher Nicole, 01/16/2014 , 10:14 AM

## 2014-01-16 NOTE — [facility] Group Notes (Signed)
[facility]   01/16/2014 8:45 AM  Participation Quality:  Alert, Appropriate and Oriented  Mood/Affect:  Calm  Depression Rating:  0  Anxiety Rating:  4  Thoughts of Suicide:  Pt denies SI/HI  Will you contract for safety?   Yes  Current AVH:  Pt denies  Plan for Discharge/Comments:  Pt attended discharge planning group and actively participated in group.  CSW provided pt with today's workbook.  Pt reports feeling well today and ready to d/c today.  [place] and [facility].  Pt declined referral for therapy.  No further needs voiced by pt at this time.    Transportation Means: Pt reports access to transportation - husband will pick pt up  Supports: No supports mentioned at this time  Reyes IvanChelsea Horton, LCSW 01/16/2014 10:02 AM

## 2014-01-16 NOTE — Progress Notes (Signed)
Patient ID: Michaela [name], female   DOB: [birth year], 38 y.o.   MRN: [id number]  Pt. Denies SI/HI and A/V hallucinations. Patient rates her depression and hopelessness at 1/10 for the day. Belongings returned to patient at time of discharge. Patient denies any  New onset of pain or discomfort. Discharge instructions and medications were reviewed with patient. Patient verbalized understanding of both medications and discharge instructions. Q15 minute safety checks were maintained until discharge. No distress noted upon discharge.

## 2014-01-18 NOTE — Progress Notes (Addendum)
Patient Discharge Instructions:  After Visit Summary (AVS):   Faxed to:  01/18/14 Psychiatric Admission Assessment Note:   Faxed to:  01/18/14 Suicide Risk Assessment - Discharge Assessment:   Faxed to:  01/18/14 Faxed/Sent to the Next Level Care provider:  01/18/14 [facility] to [facility] @ [phone]  Jerelene ReddenSheena E Kachemak, 01/18/2014, 4:08 PM

## 2014-01-23 NOTE — Discharge Summary (Signed)
Physician Discharge Summary Note  Michaela Michaela [name]:  Michaela Michaela [name] is an 38 y.o., Michaela [name] MRN:  [id number] DOB:  December 28, 1975 Michaela Michaela [name] phone:  [phone] (home)  Michaela Michaela [name] address:   [address] Michaela Michaela [name] [phone],  Total Time spent with Michaela Michaela [name]: 30 minutes  Date of Admission:  01/11/2014 Date of Discharge: 01/16/2014  Reason for Admission:  MDD with SI  Discharge Diagnoses: Active Problems:   Bipolar affective disorder, current episode manic with psychotic symptoms   GAD (generalized anxiety disorder)   Panic attacks   Psychiatric Specialty Exam: Physical Exam  Review of Systems  Constitutional: Negative.   HENT: Negative.   Eyes: Negative.   Respiratory: Negative.   Cardiovascular: Negative.   Gastrointestinal: Negative.   Genitourinary: Negative.   Musculoskeletal: Negative.   Skin: Negative.   Neurological: Negative.   Endo/Heme/Allergies: Negative.   Psychiatric/Behavioral: Positive for depression. The Michaela Michaela [name] is nervous/anxious.     Blood pressure 105/76, pulse 111, temperature 97.8 F (36.6 C), temperature source Oral, resp. rate 18, height 5\' 6"  (1.676 m), weight 69.4 kg (153 lb).Body mass index is 24.71 kg/(m^2).   General Appearance: Fairly Groomed  Patent attorneyye Contact::  Fair  Speech:  Clear and Coherent  Volume:  Normal  Mood:  worried  Affect:  worried  Thought Process:  Coherent and Goal Directed  Orientation:  Full (Time, Place, and Person)  Thought Content:  concerns about her anxiety when she goes back to work without the diazepam  Suicidal Thoughts:  No  Homicidal Thoughts:  No  Memory:  Immediate;   Fair Recent;   Fair Remote;   Fair  Judgement:  Fair  Insight:  Present  Psychomotor Activity:  Restlessness  Concentration:  Fair  Recall:  FiservFair  Fund of Knowledge:NA  Language: Fair  Akathisia:  No  Handed:    AIMS (if indicated):     Assets:  Desire for Improvement Housing Social Support Vocational/Educational  Sleep:  Number of Hours:  6.75    Musculoskeletal: Strength & Muscle Tone: within normal limits Gait & Station: normal Michaela Michaela [name] leans: N/A  DSM5:   AXIS I:  Bipolar Disorder, GAD, Panic Attacks AXIS II:  No diagnosis AXIS III:   Past Medical History  Diagnosis Date  . Anxiety   . Depression   . Interstitial cystitis 2008    "from 1 to 10 I'm a 10"   AXIS IV:  other psychosocial or environmental problems and problems related to social environment AXIS V:  61-70 mild symptoms  Level of Care:  OP  Hospital Course:  Michaela Michaela [name] is a 38 years old Michaela Michaela [name] who was recently Married on Valentine's Day and has a two young daughters 408 and 38 years old, 38 years old her daughter was sent to her dad's in [place] because she cannot manage her behaviors about 4 months ago. Michaela Michaela [name] was admitted voluntarily and emergently from her primary psychiatrist office with a increased symptoms of depression and suicide attempt followed by an argument with her husband regarding her mental illness. Michaela Michaela [name] has been diagnosed with bipolar disorder and has been receiving medication management over 7 years. Michaela Michaela [name] reported initially she was suffered with postpartum depression later she has been diagnosed with depression and anxiety and they see with multiple medication trials without significant improvements. 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 Michaela Michaela [name] husband and blaming her for being lazy and she tried to get him by asking to go up bipolar disorder and then her husband refused to take it himself she tried to kill herself by laceration on her left forearm with broken bottle. Michaela Michaela [name] stated she tried to get his attention and to minimize it as a suicide attempt.  Michaela Michaela [name] primary psychiatrist believed that Michaela Michaela [name] has been noncompliant with her medication management and become more difficult to manage as outpatient. Michaela Michaela [name] has a family history significant bipolar disorder and drug of abuse in her mother. Michaela Michaela [name] reported she is also suffering with interstitial cystitis since she was given birth to her 58 years old daughter. Michaela Michaela [name] denied a history of drug abuse and alcoholism, she has no reported legal problems. Michaela Michaela [name] husband of 14 years was court-ordered to live with it his parents and given tried custody to the Michaela Michaela [name]. Michaela Michaela [name] has a good relationship her dad and her brother who does not have any mental illness. Michaela Michaela [name] does not have a good relationship with her mother who has been diagnosed with bipolar disorder and substance abuse. Michaela Michaela [name] requested a proper medication changes to manage her current symptoms of depression and anxiety and lack of interest and concentration. Michaela Michaela [name] stated she gained 45 pounds of body weight since she received a steroid treatment of interstitial cystitis and she does not want to take medication that make her gain more weight. She has consented treatment with Latuda, Wellbutrin, trazodone and provide smaller dose of Valium and discontinue her home medication lithium, Lamictal and Ambien at this time because she has been partially compliant with medication or medication is not working. Michaela Michaela [name] lithium level is 0.63 which is subtherapeutic and his index screen was negative her drug of abuse.   During Hospitalization: Medications managed, psychoeducation, group and individual therapy. Pt currently denies SI, HI, and Psychosis. At discharge, pt rates anxiety and depression as minimal. Pt states that she does have a good supportive home environment and will followup with outpatient treatment. Affirms agreement with medication regimen and discharge plan. Denies other physical and psychological concerns at time of discharge.   Consults:   None  Significant Diagnostic Studies:  None  Discharge Vitals:   Blood pressure 105/76, pulse 111, temperature 97.8 F (36.6 C), temperature source Oral, resp. rate 18, height 5\' 6"  (1.676 m), weight 69.4 kg (153 lb). Body mass index is 24.71 kg/(m^2). Lab Results:   No results found for this or any previous visit (from the past 72 hour(s)).  Physical Findings: AIMS: Facial and Oral Movements Muscles of Facial Expression: None, normal Lips and Perioral Area: None, normal Jaw: None, normal Tongue: None, normal,Extremity Movements Upper (arms, wrists, hands, fingers): None, normal Lower (legs, knees, ankles, toes): None, normal, Trunk Movements Neck, shoulders, hips: None, normal, Overall Severity Severity of abnormal movements (highest score from questions above): None, normal Incapacitation due to abnormal movements: None, normal Michaela Michaela [name]'s awareness of abnormal movements (rate only Michaela Michaela [name]'s report): No Awareness, Dental Status Current problems with teeth and/or dentures?: No Does Michaela Michaela [name] usually wear dentures?: No  CIWA:  CIWA-Ar Total: 1 COWS:  COWS Total Score: 2  Psychiatric Specialty Exam: See Psychiatric Specialty Exam and Suicide Risk Assessment completed by Attending Physician prior to discharge.  Discharge destination:  Home  Is Michaela Michaela [name] on multiple antipsychotic therapies at discharge:  No   Has Michaela Michaela [name] had three or more failed trials of antipsychotic monotherapy by history:  No  Recommended Plan  for Multiple Antipsychotic Therapies: NA     Medication List    STOP taking these medications       lamoTRIgine 100 MG tablet  Commonly known as:  LAMICTAL     lithium carbonate 450 MG CR tablet  Commonly known as:  ESKALITH     zolpidem 5 MG tablet  Commonly known as:  AMBIEN      TAKE these medications     Indication   buPROPion 150 MG 24 hr tablet  Commonly known as:  WELLBUTRIN XL  Take 1 tablet (150 mg total) by mouth daily.   Indication:  mood  stabilization     diazepam 5 MG tablet  Commonly known as:  VALIUM  Take 1 tablet (5 mg total) by mouth every 12 (twelve) hours as needed for anxiety.   Indication:  anxiety     hydrOXYzine 25 MG tablet  Commonly known as:  ATARAX/VISTARIL  Take 1 tablet (25 mg total) by mouth every 6 (six) hours as needed for anxiety.   Indication:  anxiety     lurasidone 80 MG Tabs tablet  Commonly known as:  LATUDA  Take 1 tablet (80 mg total) by mouth daily with breakfast.   Indication:  mood stabilization     traZODone 100 MG tablet  Commonly known as:  DESYREL  Take 1 tablet (100 mg total) by mouth at bedtime.   Indication:  Trouble Sleeping           Follow-up Information   [facility]. (Appointment scheduled at 11:15 am on this date with Dr. Evelene CroonKaur for medication management. )    Contact information:   [address] [place], [place] [address] Phone: [phone] Fax: [phone]      Follow-up recommendations:  Activity:  As tolerated Diet:  Heart healthy with low sodium.  Comments:  Take all medications as prescribed. Keep all follow-up appointments as scheduled.  Do not consume alcohol or use illegal drugs while on prescription medications. Report any adverse effects from your medications to your primary care provider promptly.  In the event of recurrent symptoms or worsening symptoms, call 911, a crisis hotline, or go to the nearest emergency department for evaluation.   Total Discharge Time:  Greater than 30 minutes.  Signed: Beau FannyJohn C Withrow, FNP-BC 01/16/2014, 11:59 AM  Personally evaluated the Michaela Michaela [name] and agree with assessment and plan Madie RenoIrving A. Sauk Village[place]o, [place].D

## 2014-03-23 ENCOUNTER — Ambulatory Visit
Admission: RE | Admit: 2014-03-23 | Discharge: 2014-03-23 | Disposition: A | Discharge status: Home / Self Care | Payer: Managed Care, Other (non HMO) | Source: Ambulatory Visit | Attending: Gastroenterology | Admitting: Gastroenterology

## 2014-03-23 ENCOUNTER — Other Ambulatory Visit: Payer: Self-pay | Admitting: Gastroenterology

## 2014-03-23 DIAGNOSIS — K5900 Constipation, unspecified: Secondary | ICD-10-CM

## 2014-03-30 ENCOUNTER — Inpatient Hospital Stay: Payer: Self-pay | Admitting: Psychiatry

## 2014-03-30 LAB — URINALYSIS, COMPLETE
BILIRUBIN, UR: NEGATIVE
Blood: NEGATIVE
GLUCOSE, UR: NEGATIVE mg/dL (ref 0–75)
KETONE: NEGATIVE
Leukocyte Esterase: NEGATIVE
Nitrite: NEGATIVE
PH: 6 (ref 4.5–8.0)
PROTEIN: NEGATIVE
RBC,UR: 1 /HPF (ref 0–5)
Specific Gravity: 1.014 (ref 1.003–1.030)
WBC UR: 1 /HPF (ref 0–5)

## 2014-03-30 LAB — BEHAVIORAL MEDICINE 1 PANEL
ALT: 30 U/L
AST: 15 U/L (ref 15–37)
Albumin: 4.1 g/dL (ref 3.4–5.0)
Alkaline Phosphatase: 61 U/L
Anion Gap: 7 (ref 7–16)
BASOS ABS: 0 10*3/uL (ref 0.0–0.1)
BILIRUBIN TOTAL: 0.3 mg/dL (ref 0.2–1.0)
BUN: 12 mg/dL (ref 7–18)
Basophil %: 0.5 %
CREATININE: 1.01 mg/dL (ref 0.60–1.30)
Calcium, Total: 9.1 mg/dL (ref 8.5–10.1)
Chloride: 105 mmol/L (ref 98–107)
Co2: 29 mmol/L (ref 21–32)
EOS ABS: 0.2 10*3/uL (ref 0.0–0.7)
EOS PCT: 3.3 %
Glucose: 81 mg/dL (ref 65–99)
HCT: 41 % (ref 35.0–47.0)
HGB: 13.2 g/dL (ref 12.0–16.0)
LYMPHS ABS: 1.5 10*3/uL (ref 1.0–3.6)
Lymphocyte %: 29.7 %
MCH: 32 pg (ref 26.0–34.0)
MCHC: 32.1 g/dL (ref 32.0–36.0)
MCV: 100 fL (ref 80–100)
Monocyte #: 0.4 x10 3/mm (ref 0.2–0.9)
Monocyte %: 7 %
NEUTROS ABS: 3.1 10*3/uL (ref 1.4–6.5)
NEUTROS PCT: 59.5 %
Osmolality: 280 (ref 275–301)
Platelet: 248 10*3/uL (ref 150–440)
Potassium: 4.4 mmol/L (ref 3.5–5.1)
RBC: 4.12 10*6/uL (ref 3.80–5.20)
RDW: 13.9 % (ref 11.5–14.5)
Sodium: 141 mmol/L (ref 136–145)
Thyroid Stimulating Horm: 1.71 u[IU]/mL
Total Protein: 7.1 g/dL (ref 6.4–8.2)
WBC: 5.2 10*3/uL (ref 3.6–11.0)

## 2014-03-30 LAB — PREGNANCY, URINE: Pregnancy Test, Urine: NEGATIVE m[IU]/mL

## 2014-04-03 ENCOUNTER — Ambulatory Visit: Payer: Self-pay | Admitting: Psychiatry

## 2014-04-08 ENCOUNTER — Ambulatory Visit: Payer: Self-pay | Admitting: Psychiatry

## 2014-05-09 ENCOUNTER — Ambulatory Visit: Payer: Self-pay | Admitting: Psychiatry

## 2014-06-09 ENCOUNTER — Ambulatory Visit: Payer: Self-pay | Admitting: Psychiatry

## 2014-07-10 ENCOUNTER — Ambulatory Visit: Payer: Self-pay | Admitting: Psychiatry

## 2014-11-07 ENCOUNTER — Ambulatory Visit
Admit: 2014-11-07 | Disposition: A | Discharge status: Home / Self Care | Payer: Self-pay | Attending: Psychiatry | Admitting: Psychiatry

## 2014-12-08 ENCOUNTER — Ambulatory Visit
Admit: 2014-12-08 | Disposition: A | Discharge status: Home / Self Care | Payer: Self-pay | Attending: Psychiatry | Admitting: Psychiatry

## 2014-12-08 HISTORY — DX: Other specified symptoms and signs involving the circulatory and respiratory systems: R09.89

## 2014-12-30 NOTE — H&P (Signed)
PATIENT NAME:  Michaela [name], Michaela [name] MR#:  [id number] DATE OF BIRTH:  [birth year]  DATE OF ADMISSION:  03/30/2014  IDENTIFYING INFORMATION AND CHIEF COMPLAINT: A 39 year old woman with a history of bipolar disorder who is admitted to the hospital for initiation of ECT.   CHIEF COMPLAINT: "I'm nervous."   HISTORY OF PRESENT ILLNESS: Information obtained from the patient and from the interview I had with her last Friday. This patient reports a history of a diagnosis of bipolar disorder. Current symptoms of depression have been going on for several years. Her chief complaints are decreased energy, lack of interest in normal activities, anxiety, mental slowing. She does not sleep well unless she takes medication. She does not have any change to her appetite. Does not report psychotic symptoms. Is not having any current suicidal ideation, but recently she had some self-mutilation which was a new behavior and she is worried about that. She is currently seeing Dr. Evelene Croon in [place] and has been on several medications and is still symptomatic and has therefore been referred for ECT.   PAST PSYCHIATRIC HISTORY: The patient has had 1 prior hospitalization at [facility] fairly recently. She is ambivalent about how much change there was at that time. She has been on several medications for her illness, including several antidepressants in the past, lithium, history of Depakote and current Latuda. The patient does not have a history of suicide attempts, but does have a history of self-mutilation. She has a history of multiple manic or hypomanic episodes in the past, but has not had one in several years.   SOCIAL HISTORY: The patient is married. She has 2 adolescent daughters from a previous relationship who are living with her most of the time, but are away for the summer visiting their father. The patient previously has held white-collar jobs and functioned well, but has not been able to work or function in quite some  time.   PAST MEDICAL HISTORY: She has interstitial cystitis. She has had surgery for it in the past. Not currently taking medicine for it. Has moderate chronic discomfort.   FAMILY HISTORY: Positive for some depression.   SUBSTANCE ABUSE HISTORY: Does not drink. Does not abuse drugs. Denies any past history of substance abuse.   CURRENT MEDICATIONS: Wellbutrin 150 mg once a day, lithium carbonate 450 mg at night, amantadine 100 mg twice a day and Latuda 120 mg per day. Also was taking diazepam at an unclear dose on a p.r.n. basis.   ALLERGIES: CAFFEINE.   REVIEW OF SYSTEMS: Currently feels anxious, mildly depressed and tired. Denies suicidal ideation. Denies hallucinations. Chronic discomfort in her bladder. No other specific physical symptoms.   MENTAL STATUS EXAMINATION: Neatly groomed, cooperative with the interview. Good eye contact. Psychomotor activity slow and limited. Speech is decreased in total amount but understandable. Affect blunted. Mood stated as being okay. Thoughts are lucid, a little bit slow and hesitant. No evidence of delusions. Denies auditory or visual hallucinations. Denies suicidal or homicidal ideation. Judgment and insight appear to be adequate. Short and long-term memory adequate to bedside testing. Normal intelligence.   PHYSICAL EXAMINATION: GENERAL: The patient appears to be in no acute physical distress.  SKIN: No skin lesions identified.  HEENT: Pupils equal and reactive. Face symmetric. Oral mucosa normal. Dentition normal. Gait is within normal limits. Full range of motion at all extremities. Neck and back normal flexion and nontender. Strength and reflexes symmetric and normal throughout. Cranial nerves symmetric and normal.  LUNGS: Clear. No wheezes. No  abnormal breath sounds.  HEART: Regular rate and rhythm. No abnormal sounds.  ABDOMEN: Soft and nontender, normal bowel sounds.  CURRENT VITAL SIGNS: Include blood pressure 110/74, respirations 18, pulse 78,  temperature 98.6.   LABORATORY RESULTS: Labs are still in the process of being collected. Right now we do have some chemistries back. On her basic chemistry panel and her CBC, she is completely normal throughout.   ASSESSMENT: This is a 39 year old woman with a history of bipolar disorder, depressed, who has continued to have symptoms with worsening anxiety and depression and lack of interest and poor function recently despite medication trials. She was referred for consideration of ECT. The patient was agreeable to the prospect of ECT. She has been educated about the use and the specifics of the treatment plan. She understands that multiple sessions are required. I had asked her last week if she was going to come in for ECT to discontinue her diazepam and she has done so. I also asked her to discontinue the lithium and she has done that as well.   TREATMENT PLAN: Full set of labs will be obtained including chest x-ray and EKG. Continue current medications. P.r.n. trazodone as needed for sleep tonight. Include in groups and activities.   Plan is for the ECT nurse to see her this afternoon and get consent signed and we will have a first right unilateral ECT treatment scheduled for tomorrow morning. After the treatment, if the patient is doing well clinically and does not need hospitalization, I anticipate her going home and following up with outpatient ECT.   DIAGNOSIS, PRINCIPAL AND PRIMARY:  AXIS I: Bipolar disorder, type I, currently depressed.   SECONDARY DIAGNOSES: AXIS I: No further.  AXIS II: No diagnosis.  AXIS III: Interstitial cystitis.  AXIS IV: Moderate from being out of work and chronic illness.  AXIS V: Functioning at time of evaluation 35.  ____________________________ Audery AmelJohn T. Clapacs, MD jtc:sb D: 03/30/2014 14:58:53 ET T: 03/30/2014 15:14:27 ET JOB#: [id number]  cc: Audery AmelJohn T. Clapacs, MD, <Dictator> Audery AmelJOHN T CLAPACS MD ELECTRONICALLY SIGNED 04/19/2014 0:38

## 2014-12-30 NOTE — Discharge Summary (Signed)
PATIENT NAME:  Michaela [name], Michaela [name] MR#:  [id number] DATE OF BIRTH:  Aug 08, 1976  DATE OF ADMISSION:  03/30/2014 DATE OF DISCHARGE:  03/31/2014  IDENTIFYING INFORMATION AND HOSPITAL COURSE: A 39 year old woman admitted for ECT  workup and initiation. See full dictated history and physical for details of admission. The patient was admitted yesterday with a diagnosis of major depression. She had all of the preoperative anesthesia workup and testing done. Everything was fine without anything that would contraindicate ECT. She was given ample opportunity to ask questions about treatment and met with the ECT team and viewed the film. She signed consent for treatment. This morning, she was given ECT treatment, right unilateral. She tolerated treatment well without any side effects or difficulties. This afternoon, she reports that she is feeling good and is agreeable to further outpatient treatment. I have spoken to her outpatient psychiatrist and informed her of the treatment plan. The patient is to be discharged today and will come back on Monday, Wednesday and Friday next week for followup ECT treatment.   MENTAL STATUS AT DISCHARGE: Neatly dressed and groomed woman, looks her stated age, cooperative with the interview. Good eye contact, normal psychomotor activity. Speech normal rate, tone and volume.  Affect euthymic, reactive. Mood stated as okay. Thoughts are lucid. No loosening of associations or delusions. Denies auditory or visual hallucinations. Denies suicidal or homicidal ideation. Short and long-term memory intact. Alert and oriented x4.   LABORATORY RESULTS: The EKG was entirely normal. Chest x-ray unremarkable. She had no abnormalities at all on any of her chemistry or CBC and her urinalysis was unremarkable.   DISCHARGE MEDICATIONS: She is to continue her outpatient medicines of Latuda  120 mg once a day, trazodone 50 mg at night, Wellbutrin XL 150 mg once a day and diazepam 2 mg twice a day as  needed for anxiety, although she is encouraged to minimize it.   DISPOSITION: Discharge home. Continue seeing Dr. Toy Care for psychiatric treatment, which she will return here for ECT Monday.   DIAGNOSIS, PRINCIPAL AND PRIMARY:  AXIS I: Major depression, severe, recurrent.  SECONDARY DIAGNOSES:  AXIS I:  No further. AXIS II: Deferred.  AXIS III: Interstitial cystitis.  AXIS IV: Moderate to severe from job impairment and illness.  AXIS V: Functioning at time of discharge 70.    ____________________________ Gonzella Lex, MD jtc:ds D: 03/31/2014 14:50:03 ET T: 03/31/2014 17:40:35 ET JOB#: [id number]  cc: Gonzella Lex, MD, <Dictator> Gonzella Lex MD ELECTRONICALLY SIGNED 04/19/2014 0:38

## 2015-01-02 ENCOUNTER — Encounter: Payer: Self-pay | Admitting: Psychiatry

## 2015-01-10 ENCOUNTER — Other Ambulatory Visit: Payer: Self-pay

## 2015-01-10 ENCOUNTER — Inpatient Hospital Stay: Admission: RE | Admit: 2015-01-10 | Payer: Private Health Insurance - Indemnity | Source: Ambulatory Visit

## 2015-01-12 ENCOUNTER — Encounter: Payer: Self-pay | Admitting: Certified Registered Nurse Anesthetist

## 2015-01-12 ENCOUNTER — Inpatient Hospital Stay: Admission: RE | Admit: 2015-01-12 | Payer: Private Health Insurance - Indemnity | Source: Ambulatory Visit

## 2015-01-16 ENCOUNTER — Ambulatory Visit: Payer: BLUE CROSS/BLUE SHIELD | Attending: Psychology | Admitting: Psychology

## 2015-01-16 ENCOUNTER — Encounter: Payer: Self-pay | Admitting: Psychology

## 2015-01-16 DIAGNOSIS — R412 Retrograde amnesia: Secondary | ICD-10-CM | POA: Diagnosis not present

## 2015-01-16 DIAGNOSIS — Z9889 Other specified postprocedural states: Secondary | ICD-10-CM

## 2015-01-16 DIAGNOSIS — F3130 Bipolar disorder, current episode depressed, mild or moderate severity, unspecified: Secondary | ICD-10-CM | POA: Diagnosis not present

## 2015-01-16 DIAGNOSIS — R413 Other amnesia: Secondary | ICD-10-CM | POA: Diagnosis not present

## 2015-01-16 NOTE — Progress Notes (Signed)
[facility]  [address]   Telephone [phone] [address] Fax [phone] [place], [place] [address]  Initial Contact Note  Name:  Michaela [name] Date of Birth; 23-Jul-1976 MRN:  [id number] Date:  01/16/2015  Michaela StallingKimberly C Grimes is an 39 y.o. female who was referred for neuropsychological evaluation by Barnett Abuupindar Kaur, MD due to problems with memory. Of note, she is currently undergoing ECT treatments for depression..   A total of 5 hours was spent today reviewing medical records, interviewing (CPT [phone] Michaela StallingKimberly C Frerking and administering and scoring neurocognitive tests (CPT [phone]).  Preliminary Diagnostic Impressions: Bipolar Disorder, depressed [F31.30] per psychiatry Amnesia (retrograde) [R41.2] Memory Loss [41.3] History of Electroconvulsive therapy [Z98.89]   There were no concerns expressed or behaviors displayed by Michaela StallingKimberly C Samarin that would require immediate attention.   A full report will follow once the planned testing has been completed. His/her next appointment is scheduled for 01/22/15.   Gladstone PihMichael F. Ras Kollman, Ph.D Licensed Psychologist 01/16/2015

## 2015-01-17 ENCOUNTER — Other Ambulatory Visit: Payer: Self-pay | Admitting: *Deleted

## 2015-01-19 ENCOUNTER — Encounter: Payer: Self-pay | Admitting: Certified Registered"

## 2015-01-19 ENCOUNTER — Encounter
Admission: RE | Admit: 2015-01-19 | Discharge: 2015-01-19 | Disposition: A | Discharge status: Home / Self Care | Payer: BLUE CROSS/BLUE SHIELD | Source: Ambulatory Visit | Attending: Family Medicine | Admitting: Family Medicine

## 2015-01-19 DIAGNOSIS — F332 Major depressive disorder, recurrent severe without psychotic features: Secondary | ICD-10-CM | POA: Diagnosis present

## 2015-01-19 HISTORY — DX: Bipolar disorder, unspecified: F31.9

## 2015-01-19 MED ORDER — KETOROLAC TROMETHAMINE 30 MG/ML IJ SOLN
30.0000 mg | Freq: Once | INTRAMUSCULAR | Status: AC
  Administered 2015-01-19: 30 mg via INTRAVENOUS

## 2015-01-19 MED ORDER — LIDOCAINE HCL (CARDIAC) 20 MG/ML IV SOLN
4.0000 mg | Freq: Once | INTRAVENOUS | Status: AC
  Administered 2015-01-19: 4 mg via INTRAVENOUS

## 2015-01-19 MED ORDER — SUCCINYLCHOLINE CHLORIDE 20 MG/ML IJ SOLN
70.0000 mg | Freq: Once | INTRAMUSCULAR | Status: AC
  Administered 2015-01-19: 70 mg via INTRAVENOUS

## 2015-01-19 MED ORDER — DEXTROSE 5 % IV SOLN
250.0000 mL | Freq: Once | INTRAVENOUS | Status: AC
  Administered 2015-01-19: 250 mL via INTRAVENOUS

## 2015-01-19 MED ORDER — KETAMINE HCL 10 MG/ML IJ SOLN
100.0000 mg | Freq: Once | INTRAMUSCULAR | Status: AC
  Administered 2015-01-19: 100 mg via INTRAVENOUS

## 2015-01-19 MED ORDER — ROCURONIUM BROMIDE 50 MG/5ML IV SOLN
5.0000 mg | Freq: Once | INTRAVENOUS | Status: AC
  Administered 2015-01-19: 5 mg via INTRAVENOUS

## 2015-01-19 NOTE — Transfer of Care (Addendum)
Immediate Anesthesia Transfer of Care Note  Patient: Michaela [name]  Procedure(s) Performed: ECT  Patient Location: PACU  Anesthesia Type:General  Level of Consciousness: awake, alert , oriented and patient cooperative  Airway & Oxygen Therapy: Patient Spontanous Breathing and Patient connected to face mask oxygen  Post-op Assessment: Report given to RN, Post -op Vital signs reviewed and stable and Patient moving all extremities X 4  Post vital signs: Reviewed and stable  Last Vitals:  Filed Vitals:   01/19/15 1200  BP: 114/74  Pulse: 88  Temp: 37.3 C  Resp: 16    Complications: No apparent anesthesia complications

## 2015-01-19 NOTE — Anesthesia Preprocedure Evaluation (Signed)
Anesthesia Evaluation  Patient identified by MRN, date of birth, ID band Patient awake    Reviewed: Allergy & Precautions, NPO status , Patient's Chart, lab work & pertinent test results  Airway Mallampati: II  TM Distance: >3 FB Neck ROM: Full    Dental  (+) Teeth Intact   Pulmonary former smoker,          Cardiovascular     Neuro/Psych PSYCHIATRIC DISORDERS (Panic Attacks) Anxiety Depression Bipolar Disorder    GI/Hepatic   Endo/Other    Renal/GU      Musculoskeletal   Abdominal   Peds  Hematology   Anesthesia Other Findings   Reproductive/Obstetrics                             Anesthesia Physical Anesthesia Plan  ASA: II  Anesthesia Plan: General   Post-op Pain Management:    Induction: Intravenous  Airway Management Planned: Mask  Additional Equipment:   Intra-op Plan:   Post-operative Plan:   Informed Consent: I have reviewed the patients History and Physical, chart, labs and discussed the procedure including the risks, benefits and alternatives for the proposed anesthesia with the patient or authorized representative who has indicated his/her understanding and acceptance.     Plan Discussed with:   Anesthesia Plan Comments:         Anesthesia Quick Evaluation

## 2015-01-19 NOTE — Anesthesia Postprocedure Evaluation (Signed)
  Anesthesia Post-op Note  Patient: Michaela [name]  Procedure(s) Performed: ECT  Anesthesia type:General  Patient location: PACU  Post pain: Pain level controlled  Post assessment: Post-op Vital signs reviewed, Patient's Cardiovascular Status Stable, Respiratory Function Stable, Patent Airway and No signs of Nausea or vomiting  Post vital signs: Reviewed and stable  Last Vitals:  Filed Vitals:   01/19/15 1230  BP: 112/80  Pulse: 83  Temp: 37.3 C  Resp: 15    Level of consciousness: awake, alert  and patient cooperative  Complications: No apparent anesthesia complications

## 2015-01-19 NOTE — Anesthesia Procedure Notes (Signed)
Performed by: Zyron Deeley Airway Equipment and Method: Bite block     

## 2015-01-19 NOTE — Addendum Note (Signed)
Addendum  created 01/19/15 1311 by Michaele OfferKasey Mishti Swanton, CRNA   Modules edited: Notes Section   Notes Section:  File: [id number]

## 2015-01-19 NOTE — Procedures (Signed)
ECT SERVICES Physician's Interval Evaluation & Treatment Note  Patient Identification: Michaela [name] MRN:  [id number] Date of Evaluation:  01/19/2015 TX #: 31  MADRS:   MMSE:   P.E. Findings:  No new PE findings  Psychiatric Interval Note:  Mood good. Energy better. No si. memeory slow  Subjective:  Patient is a 39 y.o. female seen for evaluation for Electroconvulsive Therapy. Maintenance bilat  Treatment Summary:   []   Right Unilateral             [x]  Bilateral   % Energy : 1.630ms 100%   Impedance: 850  Seizure Energy Index: [id number]  Postictal Suppression Index: 98%  Seizure Concordance Index: 99%  Medications  Pre Shock: xyl 4mg , tor 30mg , ketamine 100mg , rocuronium 5mg , suc 70mg   Post Shock:    Seizure Duration: 29/35s   Comments: Follow up 3 week   Lungs:  [x]   Clear to auscultation               []  Other:   Heart:    [x]   Regular rhythm             []  irregular rhythm    [x]   Previous H&P reviewed, patient examined and there are NO CHANGES                 []   Previous H&P reviewed, patient examined and there are changes noted.   Mordecai RasmussenLAPACS, Michaela Monds, MD 5/13/201611:43 AM

## 2015-01-22 ENCOUNTER — Ambulatory Visit (INDEPENDENT_AMBULATORY_CARE_PROVIDER_SITE_OTHER): Payer: BLUE CROSS/BLUE SHIELD | Admitting: Psychology

## 2015-01-22 DIAGNOSIS — F3130 Bipolar disorder, current episode depressed, mild or moderate severity, unspecified: Secondary | ICD-10-CM | POA: Diagnosis not present

## 2015-01-22 DIAGNOSIS — R412 Retrograde amnesia: Secondary | ICD-10-CM | POA: Diagnosis not present

## 2015-01-22 DIAGNOSIS — R413 Other amnesia: Secondary | ICD-10-CM

## 2015-01-22 DIAGNOSIS — Z9889 Other specified postprocedural states: Secondary | ICD-10-CM
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
## 2015-02-07 ENCOUNTER — Other Ambulatory Visit: Payer: Self-pay

## 2015-02-09 ENCOUNTER — Inpatient Hospital Stay
Admission: RE | Admit: 2015-02-09 | Discharge: 2015-02-09 | Disposition: A | Discharge status: Home / Self Care | Payer: Private Health Insurance - Indemnity | Source: Ambulatory Visit

## 2015-02-14 ENCOUNTER — Encounter: Payer: Self-pay | Admitting: Registered Nurse

## 2015-02-14 ENCOUNTER — Encounter: Payer: Private Health Insurance - Indemnity | Admitting: Registered Nurse

## 2015-02-14 ENCOUNTER — Encounter
Admission: RE | Admit: 2015-02-14 | Discharge: 2015-02-14 | Disposition: A | Discharge status: Home / Self Care | Payer: BLUE CROSS/BLUE SHIELD | Source: Ambulatory Visit | Attending: Family Medicine | Admitting: Family Medicine

## 2015-02-14 DIAGNOSIS — F332 Major depressive disorder, recurrent severe without psychotic features: Secondary | ICD-10-CM | POA: Insufficient documentation

## 2015-02-14 MED ORDER — ROCURONIUM BROMIDE 50 MG/5ML IV SOLN
5.0000 mg | Freq: Once | INTRAVENOUS | Status: AC
  Administered 2015-02-14: 5 mg via INTRAVENOUS

## 2015-02-14 MED ORDER — SUCCINYLCHOLINE CHLORIDE 20 MG/ML IJ SOLN
70.0000 mg | Freq: Once | INTRAMUSCULAR | Status: AC
  Administered 2015-02-14: 70 mg via INTRAVENOUS

## 2015-02-14 MED ORDER — KETOROLAC TROMETHAMINE 30 MG/ML IJ SOLN
30.0000 mg | Freq: Once | INTRAMUSCULAR | Status: AC
  Administered 2015-02-14: 30 mg via INTRAVENOUS

## 2015-02-14 MED ORDER — LIDOCAINE HCL (CARDIAC) 20 MG/ML IV SOLN
4.0000 mg | Freq: Once | INTRAVENOUS | Status: AC
  Administered 2015-02-14: 4 mg via INTRAVENOUS

## 2015-02-14 MED ORDER — KETAMINE HCL 10 MG/ML IJ SOLN
100.0000 mg | Freq: Once | INTRAMUSCULAR | Status: AC
  Administered 2015-02-14: 100 mg via INTRAVENOUS

## 2015-02-14 MED ORDER — DEXTROSE 5 % IV SOLN
INTRAVENOUS | Status: DC | PRN
  Administered 2015-02-14: 12:00:00 via INTRAVENOUS

## 2015-02-14 MED ORDER — DEXTROSE 5 % IV SOLN
250.0000 mL | Freq: Once | INTRAVENOUS | Status: AC
  Administered 2015-02-14: 250 mL via INTRAVENOUS

## 2015-02-14 NOTE — Anesthesia Procedure Notes (Signed)
Performed by: Lanson Randle Pre-anesthesia Checklist: Patient identified, Emergency Drugs available, Suction available and Patient being monitored Patient Re-evaluated:Patient Re-evaluated prior to inductionOxygen Delivery Method: Circle system utilized Preoxygenation: Pre-oxygenation with 100% oxygen Intubation Type: IV induction Ventilation: Mask ventilation without difficulty and Mask ventilation throughout procedure Airway Equipment and Method: Bite block Placement Confirmation: positive ETCO2 Dental Injury: Teeth and Oropharynx as per pre-operative assessment      

## 2015-02-14 NOTE — Transfer of Care (Signed)
Immediate Anesthesia Transfer of Care Note  Patient: Michaela [name]  Procedure(s) Performed: ECT  Patient Location: PACU  Anesthesia Type:General  Level of Consciousness: awake, alert  and oriented  Airway & Oxygen Therapy: Patient Spontanous Breathing and Patient connected to face mask oxygen  Post-op Assessment: Report given to RN and Post -op Vital signs reviewed and stable  Post vital signs: Reviewed and stable  Last Vitals:  Filed Vitals:   02/14/15 1236  BP: 128/70  Pulse: 90  Temp: 37.3 C  Resp: 14    Complications: No apparent anesthesia complications

## 2015-02-14 NOTE — Anesthesia Preprocedure Evaluation (Addendum)
Anesthesia Evaluation  Patient identified by MRN, date of birth, ID band Patient awake    Reviewed: Allergy & Precautions, NPO status , Patient's Chart, lab work & pertinent test results  Airway Mallampati: II  TM Distance: >3 FB Neck ROM: Full    Dental  (+) Teeth Intact   Pulmonary former smoker,    Pulmonary exam normal       Cardiovascular Exercise Tolerance: Good Normal cardiovascular exam    Neuro/Psych    GI/Hepatic   Endo/Other    Renal/GU      Musculoskeletal   Abdominal Normal abdominal exam  (+)   Peds  Hematology   Anesthesia Other Findings   Reproductive/Obstetrics                            Anesthesia Physical Anesthesia Plan  ASA: III  Anesthesia Plan: General   Post-op Pain Management:    Induction: Intravenous  Airway Management Planned: Mask  Additional Equipment:   Intra-op Plan:   Post-operative Plan:   Informed Consent: I have reviewed the patients History and Physical, chart, labs and discussed the procedure including the risks, benefits and alternatives for the proposed anesthesia with the patient or authorized representative who has indicated his/her understanding and acceptance.     Plan Discussed with: CRNA  Anesthesia Plan Comments:        Anesthesia Quick Evaluation

## 2015-02-14 NOTE — Anesthesia Postprocedure Evaluation (Signed)
  Anesthesia Post-op Note  Patient: Michaela StallingKimberly C Sangha  Procedure(s) Performed: * No procedures listed *  Anesthesia type:General  Patient location: PACU  Post pain: Pain level controlled  Post assessment: Post-op Vital signs reviewed, Patient's Cardiovascular Status Stable, Respiratory Function Stable, Patent Airway and No signs of Nausea or vomiting  Post vital signs: Reviewed and stable  Last Vitals:  Filed Vitals:   02/14/15 1236  BP: 128/70  Pulse: 90  Temp: 37.3 C  Resp: 14    Level of consciousness: awake, alert  and patient cooperative  Complications: No apparent anesthesia complications

## 2015-02-14 NOTE — Procedures (Signed)
ECT SERVICES Physician's Interval Evaluation & Treatment Note  Patient Identification: Michaela [name] MRN:  [id number] Date of Evaluation:  02/14/2015 TX #: 32  MADRS:   MMSE:   P.E. Findings:  No physical findings. Patient stable no complaints. Vital stable.  Psychiatric Interval Note:  Mood is good no suicidal ideation she is staying active. Has minimal confusion or memory loss  Subjective:  Patient is a 39 y.o. female seen for evaluation for Electroconvulsive Therapy. No new complaints  Treatment Summary:   []   Right Unilateral             [x]  Bilateral   % Energy : 1.0 ms 100%   Impedance: 780 ohms  Seizure Energy Index: [id number] V squared  Postictal Suppression Index: 96%  Seizure Concordance Index: 98%  Medications  Pre Shock: Xylocaine 4 mg, Toradol 30 mg, ketamine 100 mg, rocuronium5 mg, succinylcholine 70 mg  Post Shock: None  Seizure Duration: 32 seconds by EMG, 32 seconds by EEG   Comments: Follow-up 3 weeks   Lungs:  [x]   Clear to auscultation               []  Other:   Heart:    [x]   Regular rhythm             []  irregular rhythm    [x]   Previous H&P reviewed, patient examined and there are NO CHANGES                 []   Previous H&P reviewed, patient examined and there are changes noted.   Mordecai RasmussenJohn Amiri Riechers, MD 6/8/201612:26 PM

## 2015-02-28 ENCOUNTER — Other Ambulatory Visit: Payer: Self-pay | Admitting: *Deleted

## 2015-03-07 ENCOUNTER — Ambulatory Visit
Admission: RE | Admit: 2015-03-07 | Discharge: 2015-03-07 | Disposition: A | Discharge status: Home / Self Care | Payer: BLUE CROSS/BLUE SHIELD | Source: Ambulatory Visit | Attending: Psychiatry | Admitting: Psychiatry

## 2015-03-07 ENCOUNTER — Encounter: Payer: BLUE CROSS/BLUE SHIELD | Admitting: Anesthesiology

## 2015-03-07 ENCOUNTER — Encounter: Payer: Self-pay | Admitting: Anesthesiology

## 2015-03-07 DIAGNOSIS — F332 Major depressive disorder, recurrent severe without psychotic features: Secondary | ICD-10-CM | POA: Insufficient documentation

## 2015-03-07 DIAGNOSIS — R569 Unspecified convulsions: Secondary | ICD-10-CM | POA: Diagnosis not present

## 2015-03-07 MED ORDER — DEXTROSE 5 % IV SOLN
250.0000 mL | Freq: Once | INTRAVENOUS | Status: AC
  Administered 2015-03-07: 250 mL via INTRAVENOUS

## 2015-03-07 MED ORDER — KETOROLAC TROMETHAMINE 30 MG/ML IJ SOLN
30.0000 mg | Freq: Once | INTRAMUSCULAR | Status: AC
  Administered 2015-03-07: 30 mg via INTRAVENOUS

## 2015-03-07 MED ORDER — SUCCINYLCHOLINE CHLORIDE 20 MG/ML IJ SOLN
70.0000 mg | Freq: Once | INTRAMUSCULAR | Status: AC
  Administered 2015-03-07: 70 mg via INTRAVENOUS

## 2015-03-07 MED ORDER — KETAMINE HCL 10 MG/ML IJ SOLN
100.0000 mg | Freq: Once | INTRAMUSCULAR | Status: AC
  Administered 2015-03-07: 100 mg via INTRAVENOUS

## 2015-03-07 MED ORDER — ROCURONIUM BROMIDE 50 MG/5ML IV SOLN
5.0000 mg | Freq: Once | INTRAVENOUS | Status: AC
  Administered 2015-03-07: 5 mg via INTRAVENOUS

## 2015-03-07 MED ORDER — SODIUM CHLORIDE 0.9 % IV SOLN
INTRAVENOUS | Status: DC | PRN
  Administered 2015-03-07: 12:00:00 via INTRAVENOUS

## 2015-03-07 MED ORDER — LIDOCAINE HCL (CARDIAC) 20 MG/ML IV SOLN
4.0000 mg | Freq: Once | INTRAVENOUS | Status: AC
  Administered 2015-03-07: 4 mg via INTRAVENOUS

## 2015-03-07 NOTE — Procedures (Signed)
ECT SERVICES Physician's Interval Evaluation & Treatment Note  Patient Identification: Michaela [name] MRN:  [id number] Date of Evaluation:  03/07/2015 TX #: 33  MADRS:   MMSE:   P.E. Findings:  No new physical complaints or new findings  Psychiatric Interval Note:  Mood is been feeling good no new complaints no cognitive problems  Subjective:  Patient is a 39 y.o. female seen for evaluation for Electroconvulsive Therapy. Generally feeling good no complaints  Treatment Summary:   [x]   Right Unilateral             [x]  Bilateral   % Energy : 1.0 ms 100%  Impedance:  510 ohms  Seizure Energy Index:  24,057 V squared  Postictal Suppression Index:  99%  Seizure Concordance Index:  96%  Medications  Pre Shock:  Xylocaine 4 mg, Toradol 30 mg, ketamine 100 mg, rock 5 mg, succinylcholine 70 mg  Post Shock:  none  Seizure Duration:  17 seconds by EMG 35 seconds by EEG   Comments:  follow-up 3 weeks   Lungs:  [x]   Clear to auscultation               []  Other:   Heart:    [x]   Regular rhythm             []  irregular rhythm    [x]   Previous H&P reviewed, patient examined and there are NO CHANGES                 []   Previous H&P reviewed, patient examined and there are changes noted.   Mordecai RasmussenJohn Clapacs, MD 6/29/201611:57 AM

## 2015-03-07 NOTE — Transfer of Care (Signed)
Immediate Anesthesia Transfer of Care Note  Patient: Michaela StallingKimberly C [name]  Procedure(s) Performed: * No procedures listed *  Patient Location: PACU  Anesthesia Type:General  Level of Consciousness: awake, alert  and oriented  Airway & Oxygen Therapy: Patient Spontanous Breathing and Patient connected to face mask oxygen  Post-op Assessment: Report given to RN and Post -op Vital signs reviewed and stable  Post vital signs: Reviewed  Last Vitals: 100% 86 hr 100.2 temp 16r 86/67 Filed Vitals:   03/07/15 0947  BP: 123/73  Pulse: 93  Temp: 36.8 C  Resp: 18    Complications: No apparent anesthesia complications

## 2015-03-07 NOTE — Anesthesia Preprocedure Evaluation (Signed)
Anesthesia Evaluation  Patient identified by MRN, date of birth, ID band Patient awake    Reviewed: Allergy & Precautions, NPO status   History of Anesthesia Complications Negative for: history of anesthetic complications  Airway Mallampati: II       Dental no notable dental hx.    Pulmonary former smoker,    Pulmonary exam normal       Cardiovascular negative cardio ROS Normal cardiovascular exam    Neuro/Psych PSYCHIATRIC DISORDERS negative neurological ROS     GI/Hepatic negative GI ROS, Neg liver ROS,   Endo/Other  negative endocrine ROS  Renal/GU negative Renal ROS  negative genitourinary   Musculoskeletal negative musculoskeletal ROS (+)   Abdominal Normal abdominal exam  (+)   Peds negative pediatric ROS (+)  Hematology negative hematology ROS (+)   Anesthesia Other Findings   Reproductive/Obstetrics negative OB ROS                             Anesthesia Physical Anesthesia Plan  ASA: II  Anesthesia Plan: General   Post-op Pain Management:    Induction: Intravenous  Airway Management Planned: Mask  Additional Equipment:   Intra-op Plan:   Post-operative Plan:   Informed Consent: I have reviewed the patients History and Physical, chart, labs and discussed the procedure including the risks, benefits and alternatives for the proposed anesthesia with the patient or authorized representative who has indicated his/her understanding and acceptance.     Plan Discussed with: CRNA  Anesthesia Plan Comments:         Anesthesia Quick Evaluation

## 2015-03-08 NOTE — Anesthesia Postprocedure Evaluation (Signed)
  Anesthesia Post-op Note  Patient: Michaela [name]  Procedure(s) Performed: * No procedures listed *  Anesthesia type:General  Patient location: PACU  Post pain: Pain level controlled  Post assessment: Post-op Vital signs reviewed, Patient's Cardiovascular Status Stable, Respiratory Function Stable, Patent Airway and No signs of Nausea or vomiting  Post vital signs: Reviewed and stable  Last Vitals:  Filed Vitals:   03/07/15 1247  BP: 125/67  Pulse: 87  Temp:   Resp: 18    Level of consciousness: awake, alert  and patient cooperative  Complications: No apparent anesthesia complications

## 2015-03-19 ENCOUNTER — Encounter: Payer: Private Health Insurance - Indemnity | Admitting: Oncology

## 2015-03-23 ENCOUNTER — Other Ambulatory Visit: Payer: Self-pay

## 2015-03-30 ENCOUNTER — Encounter: Payer: Self-pay | Admitting: Certified Registered Nurse Anesthetist

## 2015-03-30 ENCOUNTER — Encounter: Payer: BLUE CROSS/BLUE SHIELD | Admitting: Certified Registered Nurse Anesthetist

## 2015-03-30 ENCOUNTER — Encounter
Admission: RE | Admit: 2015-03-30 | Discharge: 2015-03-30 | Disposition: A | Discharge status: Home / Self Care | Payer: BLUE CROSS/BLUE SHIELD | Source: Ambulatory Visit | Attending: Family Medicine | Admitting: Family Medicine

## 2015-03-30 DIAGNOSIS — F332 Major depressive disorder, recurrent severe without psychotic features: Secondary | ICD-10-CM | POA: Diagnosis not present

## 2015-03-30 MED ORDER — ROCURONIUM BROMIDE 50 MG/5ML IV SOLN
5.0000 mg | Freq: Once | INTRAVENOUS | Status: AC
  Administered 2015-03-30: 5 mg via INTRAVENOUS

## 2015-03-30 MED ORDER — KETOROLAC TROMETHAMINE 30 MG/ML IJ SOLN
30.0000 mg | Freq: Once | INTRAMUSCULAR | Status: AC
Start: 2015-03-30 — End: 2015-03-30
  Administered 2015-03-30: 30 mg via INTRAVENOUS

## 2015-03-30 MED ORDER — KETAMINE HCL 10 MG/ML IJ SOLN
100.0000 mg | Freq: Once | INTRAMUSCULAR | Status: AC
  Administered 2015-03-30: 100 mg via INTRAVENOUS

## 2015-03-30 MED ORDER — DEXTROSE IN LACTATED RINGERS 5 % IV SOLN
INTRAVENOUS | Status: DC | PRN
  Administered 2015-03-30: 10:00:00 via INTRAVENOUS

## 2015-03-30 MED ORDER — SUCCINYLCHOLINE CHLORIDE 20 MG/ML IJ SOLN
70.0000 mg | Freq: Once | INTRAMUSCULAR | Status: AC
  Administered 2015-03-30: 70 mg via INTRAVENOUS

## 2015-03-30 MED ORDER — LIDOCAINE HCL (CARDIAC) 20 MG/ML IV SOLN
4.0000 mg | Freq: Once | INTRAVENOUS | Status: AC
  Administered 2015-03-30: 4 mg via INTRAVENOUS

## 2015-03-30 MED ORDER — DEXTROSE 5 % IV SOLN
250.0000 mL | Freq: Once | INTRAVENOUS | Status: AC
  Administered 2015-03-30: 250 mL via INTRAVENOUS

## 2015-03-30 NOTE — Anesthesia Postprocedure Evaluation (Signed)
  Anesthesia Post-op Note  Patient: Michaela [name]  Procedure(s) Performed: * No procedures listed *  Anesthesia type:General  Patient location: PACU  Post pain: Pain level controlled  Post assessment: Post-op Vital signs reviewed, Patient's Cardiovascular Status Stable, Respiratory Function Stable, Patent Airway and No signs of Nausea or vomiting  Post vital signs: Reviewed and stable  Last Vitals:  Filed Vitals:   03/30/15 1207  BP: 148/85  Pulse: 98  Temp:   Resp: 18    Level of consciousness: awake, alert  and patient cooperative  Complications: No apparent anesthesia complications

## 2015-03-30 NOTE — Procedures (Signed)
ECT SERVICES Physician's Interval Evaluation & Treatment Note  Patient Identification: Michaela [name] MRN:  [id number] Date of Evaluation:  03/30/2015 TX #: 34  MADRS:   MMSE:   P.E. Findings:   patient has had some swellin she does not have any other new complaints. She feels that her mood is actually significantly better. She has been more active. She still complains of mild short-term memory impairment which is only slightly impairingg recently and her doctor is cut back on multiple medications.  Psychiatric Interval Notemood is only bjective:  Patient is a 39 y.o. female seen for evaluation for Electroconvulsive Therapysee note above. Complains of some short-term memory impairmenteatment Summary:     Right Unilateral              Bilateral   % Energy1.0 ms 100% pedance: 400 ohms  Seizure Energy Inde29,599 V squared  Postictal Suppression Index: 97%  Seizure Concordance Inde98%Medications  Pre Shock: Xylocaine 4 mg, Toradol 30 mg, ketamine 100 mg, rocuronium 5 mg, succinylcholine 70 mgPost Shock: none   Seizure Duration: 18 seconds by EMG, 24 seconds by EEG   Comments: Follow-up in 3 weeksungs:    Clear to auscultation                Other:   Heart:      Regular rhythm              irregular rhythm      Previous H&P reviewed, patient examined and there are NO CHANGES                   Previous H&P reviewed, patient examined and there are changes noted.   Mordecai Rasmussen, MD 7/22/201611:09 AM

## 2015-03-30 NOTE — Anesthesia Preprocedure Evaluation (Signed)
Anesthesia Evaluation  Patient identified by MRN, date of birth, ID band Patient awake    Reviewed: Allergy & Precautions, NPO status   History of Anesthesia Complications Negative for: history of anesthetic complications  Airway Mallampati: II       Dental no notable dental hx. (+) Chipped   Pulmonary former smoker,    Pulmonary exam normal       Cardiovascular negative cardio ROS Normal cardiovascular exam    Neuro/Psych PSYCHIATRIC DISORDERS Anxiety Depression Bipolar Disorder negative neurological ROS     GI/Hepatic negative GI ROS, Neg liver ROS,   Endo/Other  negative endocrine ROS  Renal/GU negative Renal ROS  negative genitourinary   Musculoskeletal negative musculoskeletal ROS (+)   Abdominal Normal abdominal exam  (+)   Peds negative pediatric ROS (+)  Hematology negative hematology ROS (+)   Anesthesia Other Findings   Reproductive/Obstetrics negative OB ROS                             Anesthesia Physical Anesthesia Plan  ASA: II  Anesthesia Plan: General   Post-op Pain Management:    Induction: Intravenous  Airway Management Planned: Mask  Additional Equipment:   Intra-op Plan:   Post-operative Plan:   Informed Consent:   Plan Discussed with: CRNA  Anesthesia Plan Comments:         Anesthesia Quick Evaluation

## 2015-03-30 NOTE — Anesthesia Procedure Notes (Signed)
Performed by: Kimora Stankovic Pre-anesthesia Checklist: Patient identified, Patient being monitored, Timeout performed, Emergency Drugs available and Suction available Patient Re-evaluated:Patient Re-evaluated prior to inductionOxygen Delivery Method: Circle system utilized Preoxygenation: Pre-oxygenation with 100% oxygen Intubation Type: IV induction Ventilation: Mask ventilation without difficulty Airway Equipment and Method: Bite block Dental Injury: Teeth and Oropharynx as per pre-operative assessment      

## 2015-03-30 NOTE — Transfer of Care (Signed)
Immediate Anesthesia Transfer of Care Note  Patient: Michaela [name]  Procedure(s) Performed: * No procedures listed *  Patient Location: PACU  Anesthesia Type:General  Level of Consciousness: sedated  Airway & Oxygen Therapy: Patient Spontanous Breathing and Patient connected to face mask oxygen  Post-op Assessment: Report given to RN and Post -op Vital signs reviewed and stable  Post vital signs: Reviewed and stable  Last Vitals:  Filed Vitals:   03/30/15 1123  BP: 124/85  Pulse: 85  Temp: 37.8 C  Resp: 20    Complications: No apparent anesthesia complications

## 2015-04-03 ENCOUNTER — Encounter: Payer: BLUE CROSS/BLUE SHIELD | Admitting: Oncology

## 2015-04-16 ENCOUNTER — Other Ambulatory Visit: Payer: Self-pay | Admitting: *Deleted

## 2015-04-18 ENCOUNTER — Encounter: Payer: Self-pay | Admitting: Anesthesiology

## 2015-04-18 ENCOUNTER — Ambulatory Visit
Admission: RE | Admit: 2015-04-18 | Discharge: 2015-04-18 | Disposition: A | Discharge status: Home / Self Care | Payer: BLUE CROSS/BLUE SHIELD | Source: Ambulatory Visit | Attending: Psychiatry | Admitting: Psychiatry

## 2015-04-18 DIAGNOSIS — F332 Major depressive disorder, recurrent severe without psychotic features: Secondary | ICD-10-CM | POA: Diagnosis not present

## 2015-04-18 MED ORDER — SUCCINYLCHOLINE CHLORIDE 20 MG/ML IJ SOLN
70.0000 mg | Freq: Once | INTRAMUSCULAR | Status: AC
  Administered 2015-04-18: 70 mg via INTRAVENOUS

## 2015-04-18 MED ORDER — DEXTROSE 5 % IV SOLN
250.0000 mL | Freq: Once | INTRAVENOUS | Status: AC
  Administered 2015-04-18: 250 mL via INTRAVENOUS

## 2015-04-18 MED ORDER — ROCURONIUM BROMIDE 50 MG/5ML IV SOLN
5.0000 mg | Freq: Once | INTRAVENOUS | Status: AC
  Administered 2015-04-18: 5 mg via INTRAVENOUS

## 2015-04-18 MED ORDER — KETAMINE HCL 10 MG/ML IJ SOLN
100.0000 mg | Freq: Once | INTRAMUSCULAR | Status: AC
  Administered 2015-04-18: 100 mg via INTRAVENOUS

## 2015-04-18 MED ORDER — KETOROLAC TROMETHAMINE 30 MG/ML IJ SOLN
30.0000 mg | Freq: Once | INTRAMUSCULAR | Status: AC
  Administered 2015-04-18: 30 mg via INTRAVENOUS

## 2015-04-18 MED ORDER — LIDOCAINE HCL (CARDIAC) 20 MG/ML IV SOLN
4.0000 mg | Freq: Once | INTRAVENOUS | Status: AC
  Administered 2015-04-18: 4 mg via INTRAVENOUS

## 2015-04-18 NOTE — Anesthesia Preprocedure Evaluation (Signed)
Anesthesia Evaluation  Patient identified by MRN, date of birth, ID band Patient awake    Reviewed: Allergy & Precautions, NPO status   History of Anesthesia Complications Negative for: history of anesthetic complications  Airway Mallampati: II       Dental no notable dental hx. (+) Chipped   Pulmonary former smoker,    Pulmonary exam normal       Cardiovascular negative cardio ROS Normal cardiovascular exam    Neuro/Psych PSYCHIATRIC DISORDERS Anxiety Depression Bipolar Disorder negative neurological ROS     GI/Hepatic negative GI ROS, Neg liver ROS,   Endo/Other  negative endocrine ROS  Renal/GU negative Renal ROS  negative genitourinary   Musculoskeletal negative musculoskeletal ROS (+)   Abdominal Normal abdominal exam  (+)   Peds negative pediatric ROS (+)  Hematology negative hematology ROS (+)   Anesthesia Other Findings   Reproductive/Obstetrics negative OB ROS                             Anesthesia Physical  Anesthesia Plan  ASA: II  Anesthesia Plan: General   Post-op Pain Management:    Induction: Intravenous  Airway Management Planned: Mask  Additional Equipment:   Intra-op Plan:   Post-operative Plan:   Informed Consent: I have reviewed the patients History and Physical, chart, labs and discussed the procedure including the risks, benefits and alternatives for the proposed anesthesia with the patient or authorized representative who has indicated his/her understanding and acceptance.     Plan Discussed with: CRNA  Anesthesia Plan Comments:         Anesthesia Quick Evaluation

## 2015-04-18 NOTE — Transfer of Care (Signed)
Immediate Anesthesia Transfer of Care Note  Patient: Michaela [name]  Procedure(s) Performed: ECT  Patient Location: PACU  Anesthesia Type:General  Level of Consciousness: awake  Airway & Oxygen Therapy: Patient Spontanous Breathing and Patient connected to face mask oxygen  Post-op Assessment: Report given to RN  Post vital signs: Reviewed  Last Vitals:  Filed Vitals:   04/18/15 1134  BP: 129/83  Pulse: 89  Temp: 36.9 C  Resp: 14    Complications: No apparent anesthesia complications

## 2015-04-18 NOTE — Anesthesia Postprocedure Evaluation (Signed)
  Anesthesia Post-op Note  Patient: Michaela [name]  Procedure(s) Performed: * No procedures listed *  Anesthesia type:General  Patient location: PACU  Post pain: Pain level controlled  Post assessment: Post-op Vital signs reviewed, Patient's Cardiovascular Status Stable, Respiratory Function Stable, Patent Airway and No signs of Nausea or vomiting  Post vital signs: Reviewed and stable  Last Vitals:  Filed Vitals:   04/18/15 1210  BP: 113/79  Pulse: 92  Temp:   Resp: 14    Level of consciousness: awake, alert  and patient cooperative  Complications: No apparent anesthesia complications

## 2015-04-18 NOTE — Procedures (Signed)
ECT SERVICES Physician's Interval Evaluation & Treatment Note  Patient Identification: Michaela [name] MRN:  [id number] Date of Evaluation:  04/18/2015 TX #: 35  MADRS: 19  MMSE: 28  P.E. Findings:  No physical changes or new physical complaints  Psychiatric Interval Note:  Mood has been feeling more down and depressed. Recently had some medication changes. More tired this morning  Subjective:  Patient is a 39 y.o. female seen for evaluation for Electroconvulsive Therapy. Drowsy this morning  Treatment Summary:     Right Unilateral              Bilateral   % Energy : 1.0 ms, 100%   Impedance: 930 ohms  Seizure Energy Index: 48,163 V squared  Postictal Suppression Index: 98%  Seizure Concordance Index: 98%  Medications  Pre Shock: Xylocaine 4 mg, Toradol 30 mg, ketamine 100 mg, rocuronium 5 mg, succinylcholine 70 mg  Post Shock: None  Seizure Duration: 17 seconds by EMG, 17 seconds by EEG   Comments: Patient had very good indices but a short seizure. Seizures have been getting shorter recently. We are already on ketamine and aesthetic can have the machine turned all the way up and may not have many other options to improve seizure. Patient is to follow-up here in 2 weeks.   Lungs:    Clear to auscultation                Other:   Heart:      Regular rhythm              irregular rhythm      Previous H&P reviewed, patient examined and there are NO CHANGES                   Previous H&P reviewed, patient examined and there are changes noted.   Mordecai Rasmussen, MD 8/10/201611:19 AM

## 2015-04-24 ENCOUNTER — Telehealth (HOSPITAL_COMMUNITY): Payer: Self-pay | Admitting: *Deleted

## 2015-04-24 NOTE — Telephone Encounter (Signed)
Called for ECT authorization and was told by Wynona Canes that no authorization is required. Call reference #[id number].

## 2015-04-30 ENCOUNTER — Other Ambulatory Visit: Payer: Self-pay

## 2015-05-01 ENCOUNTER — Encounter: Payer: Self-pay | Admitting: Internal Medicine

## 2015-05-01 ENCOUNTER — Ambulatory Visit (INDEPENDENT_AMBULATORY_CARE_PROVIDER_SITE_OTHER): Payer: BLUE CROSS/BLUE SHIELD | Admitting: Internal Medicine

## 2015-05-01 VITALS — BP 104/62 | HR 85 | Temp 97.6°F | Resp 12 | Ht 65.0 in | Wt 160.0 lb

## 2015-05-01 DIAGNOSIS — R946 Abnormal results of thyroid function studies: Secondary | ICD-10-CM

## 2015-05-01 DIAGNOSIS — R635 Abnormal weight gain: Secondary | ICD-10-CM | POA: Diagnosis not present

## 2015-05-01 DIAGNOSIS — R7989 Other specified abnormal findings of blood chemistry: Secondary | ICD-10-CM

## 2015-05-01 LAB — T4, FREE: Free T4: 0.46 ng/dL — ABNORMAL LOW (ref 0.60–1.60)

## 2015-05-01 LAB — TSH: TSH: 1.03 u[IU]/mL (ref 0.35–4.50)

## 2015-05-01 LAB — T3, FREE: T3, Free: 2.7 pg/mL (ref 2.3–4.2)

## 2015-05-01 NOTE — Patient Instructions (Signed)
Please stop at the lab. We will call you with the results.  We will schedule another appointment if the results are abnormal.

## 2015-05-01 NOTE — Progress Notes (Signed)
Patient ID: JOLI KOOB, female   DOB: 12-08-1975, 39 y.o.   MRN: [id number]   HPI  Michaela [name] is a 39 y.o.-year-old female, referred by her Dr. Jarrett Soho, in consultation for ? Hypothyroidism (edema, weight changes). She is here with her husband who offers part of the history. PCP: Gweneth Dimitri, MD  I reviewed pt's thyroid tests and there are actually normal per the last check 3 months ago: 07/14/2014: TSH 2.63, free T4 0.54 (0.61-1.12), free T3 2.31 (2.5-3.9), BMP normal then 01/14/2015: TSH 1.180  Of note, patient has severe depression and was on lithium >> stopped 2 mo ago. She is also undergoing ECT treatments  Pt describes: - + weight gain - 20 lbs in 2 months - on Seroquel - cut down the dose to 50% several days ago, started Lamictal - + leg and hand swelling (allergic rxn to a psychotropic med) 2 mo ago >> swelling persisted  - + fatigue - no cold intolerance - + depression - + constipation (takes laxative) - no dry skin - resolved hair loss (had it when on Lithium)  Tried phentermine in the past >> she lost a lot of weight after pregnancy.  Pt denies feeling nodules in neck, hoarseness, dysphagia/odynophagia, SOB with lying down.  She has no FH of thyroid disorders.  No FH of thyroid cancer.  No h/o radiation tx to head or neck. No recent use of iodine supplements.  I reviewed her chart and she also has a history of interstitial cystitis - on hydrocodone prn.  ROS: Constitutional: + see HPI, + nocturia Eyes: no blurry vision, no xerophthalmia ENT: no sore throat, no nodules palpated in throat, no dysphagia/odynophagia, no hoarseness Cardiovascular: no CP/SOB/palpitations/+ leg swelling Respiratory: no cough/SOB Gastrointestinal: no N/V/D/C Musculoskeletal: no muscle/joint aches Skin: no rashes, + easy bruising Neurological: no tremors/numbness/tingling/dizziness Psychiatric:+ depression/+ anxiety  Past Medical History  Diagnosis Date  .  Anxiety   . Depression   . Interstitial cystitis 2008    "from 1 to 10 I'm a 10"  . Sinus symptom     watery, itchy eyes  . Bipolar disorder    Past Surgical History  Procedure Laterality Date  . Bladder stretch      for interstitial cystitis  . Wisdom tooth extraction    . Abdominal hysterectomy    . Breast enhancement surgery Bilateral    Social History   Social History  . Marital Status: Married    Spouse Name: N/A  . Number of Children: 2   Occupational History  .  disabled    Social History Main Topics  . Smoking status: Former Smoker -- 0.50 packs/day for .5 years    Types: Cigarettes    Quit date: 09/20/1998  . Smokeless tobacco: Never Used  . Alcohol Use: Wine, 2x a week  . Drug Use: No  . Sexual Activity: Yes    Birth Control/ Protection: Surgical     Comment: hysterectomy   Current Outpatient Prescriptions on File Prior to Visit  Medication Sig Dispense Refill  . clonazePAM (KLONOPIN) 1 MG tablet Take 1 mg by mouth 2 (two) times daily as needed for anxiety. Take 1-2 tablets by mouth twice a day as needed    . gabapentin (NEURONTIN) 100 MG capsule Take 100 mg by mouth at bedtime.    . lamoTRIgine (LAMICTAL) 25 MG tablet Take 25 mg by mouth daily. To gradually increase to 6 tabs per day    . QUEtiapine (SEROQUEL) 400 MG tablet Take  400 mg by mouth at bedtime.    . ziprasidone (GEODON) 40 MG capsule Take 40 mg by mouth daily.    . Cariprazine HCl 3 MG CAPS Take by mouth 1 day or 1 dose.    . gabapentin (NEURONTIN) 300 MG capsule Take 300 mg by mouth at bedtime.    Marland Kitchen QUEtiapine (SEROQUEL) 50 MG tablet Take 50 mg by mouth at bedtime.     No current facility-administered medications on file prior to visit.   Allergies  Allergen Reactions  . Citrus Other (See Comments)    Causes interstitual cystitis flare up  . Equetro [Carbamazepine Er] Swelling and Rash   Family History  Problem Relation Age of Onset  . Hypotension Neg Hx   . Anesthesia problems Neg Hx    . Malignant hyperthermia Neg Hx   . Pseudochol deficiency Neg Hx   . Hypertension Mother   . Hypertension Father     PE: BP 104/62 mmHg  Pulse 85  Temp(Src) 97.6 F (36.4 C) (Oral)  Resp 12  Ht 5\' 5"  (1.651 m)  Wt 160 lb (72.576 kg)  BMI 26.63 kg/m2  SpO2 97% Wt Readings from Last 3 Encounters:  05/01/15 160 lb (72.576 kg)  04/18/15 151 lb (68.493 kg)  03/30/15 153 lb (69.4 kg)   Constitutional: Slightly overweight, in NAD, tanned Eyes: PERRLA, EOMI, no exophthalmos ENT: moist mucous membranes, no thyromegaly, no cervical lymphadenopathy Cardiovascular: RRR, No MRG, no edema Respiratory: CTA B Gastrointestinal: abdomen soft, NT, ND, BS+ Musculoskeletal: no deformities, strength intact in all 4 Skin: moist, warm, no rashes Neurological: Mild tremor with outstretched hands, DTR normal in all 4  ASSESSMENT: 1. Weigh gain and fluid retention - ? hypothyroidism  PLAN:  1. I reviewed her thyroid tests from 07/2014 in 01/2015 along with the patient and her husband. I explained how the pituitary-thyroid axis functions and exactly what TSH, free T4 and free T3 mean. Her free T4 and free T3 levels were low when obtained in 07/2014, while the TSH was normal. I believe that this changes were induced from lithium. Patient has stopped lithium ~2 months ago. We discussed about rechecking her thyroid tests now along with thyroid antibodies to check for the presence of Hashimoto's thyroiditis. She has some symptoms and signs of hypothyroidism: Weight gain, fluid retention, constipation, fatigue. I explained that if she has normal TFTs with positive antibodies (euthyroid Hashimoto's thyroiditis), she may have hypothyroid symptoms without having abnormal thyroid function tests. In this case, we will need to follow her TFTs on a 6 months to a year basis. - We reviewed her medication list and the impact of each of her psychotropic medications on her weight. Seroquel is the medication that can  mostly increase her weight, but Neurontin and Cariprazine can also cause weight gain. She inquires about weight loss medicines, but I do not usually prescribe these medicines. - We will check the following tests today: Orders Placed This Encounter  Procedures  . T4, free  . Thyroid peroxidase antibody  . TSH  . T3, free  - I will schedule her to come back if the labs are abnormal  Office Visit on 05/01/2015  Component Date Value Ref Range Status  . Free T4 05/01/2015 0.46* 0.60 - 1.60 ng/dL Final  . Thyroperoxidase Ab SerPl-aCnc 05/01/2015 2  <9 IU/mL Final  . TSH 05/01/2015 1.03  0.35 - 4.50 uIU/mL Final  . T3, Free 05/01/2015 2.7  2.3 - 4.2 pg/mL Final   TFTs normal, except  free T4 a little low. This could still be the influence of lithium. I would like to repeat the thyroid tests in 2 months. No other intervention needed for now. TPO antibodies normal, so no signs of Hashimoto's thyroiditis.

## 2015-05-02 LAB — THYROID PEROXIDASE ANTIBODY: Thyroperoxidase Ab SerPl-aCnc: 2 IU/mL (ref <9)

## 2015-05-10 NOTE — Addendum Note (Signed)
Encounter addended by: Rosalita Chessman, RN on: 05/10/2015 12:19 PM<BR>     Documentation filed: PRL Based Order Sets, Orders

## 2015-05-16 NOTE — Addendum Note (Signed)
Encounter addended by: Rosalita Chessman, RN on: 05/16/2015  3:52 PM<BR>     Documentation filed: PRL Based Order Sets, Orders

## 2015-05-18 ENCOUNTER — Encounter
Admission: RE | Admit: 2015-05-18 | Discharge: 2015-05-18 | Disposition: A | Discharge status: Home / Self Care | Payer: BLUE CROSS/BLUE SHIELD | Source: Ambulatory Visit | Attending: Family Medicine | Admitting: Family Medicine

## 2015-05-18 ENCOUNTER — Encounter: Payer: Self-pay | Admitting: Anesthesiology

## 2015-05-18 DIAGNOSIS — F332 Major depressive disorder, recurrent severe without psychotic features: Secondary | ICD-10-CM | POA: Insufficient documentation

## 2015-05-18 MED ORDER — DEXTROSE 5 % IV SOLN
250.0000 mL | Freq: Once | INTRAVENOUS | Status: AC
  Administered 2015-05-18: 250 mL via INTRAVENOUS

## 2015-05-18 MED ORDER — ONDANSETRON HCL 4 MG/2ML IJ SOLN: 4.0000 mg | Freq: Once | INTRAMUSCULAR | Status: DC | PRN

## 2015-05-18 MED ORDER — LIDOCAINE HCL (CARDIAC) 20 MG/ML IV SOLN
4.0000 mg | Freq: Once | INTRAVENOUS | Status: AC
  Administered 2015-05-18: 4 mg via INTRAVENOUS

## 2015-05-18 MED ORDER — KETAMINE HCL 10 MG/ML IJ SOLN
100.0000 mg | Freq: Once | INTRAMUSCULAR | Status: AC
  Administered 2015-05-18: 100 mg via INTRAVENOUS

## 2015-05-18 MED ORDER — FENTANYL CITRATE (PF) 100 MCG/2ML IJ SOLN: 25.0000 ug | INTRAMUSCULAR | Status: DC | PRN

## 2015-05-18 MED ORDER — KETOROLAC TROMETHAMINE 30 MG/ML IJ SOLN
30.0000 mg | Freq: Once | INTRAMUSCULAR | Status: AC
  Administered 2015-05-18: 30 mg via INTRAVENOUS

## 2015-05-18 MED ORDER — ROCURONIUM BROMIDE 50 MG/5ML IV SOLN
5.0000 mg | Freq: Once | INTRAVENOUS | Status: AC
  Administered 2015-05-18: 5 mg via INTRAVENOUS

## 2015-05-18 MED ORDER — SUCCINYLCHOLINE CHLORIDE 20 MG/ML IJ SOLN
70.0000 mg | Freq: Once | INTRAMUSCULAR | Status: AC
  Administered 2015-05-18: 70 mg via INTRAVENOUS

## 2015-05-18 NOTE — Anesthesia Preprocedure Evaluation (Signed)
Anesthesia Evaluation  Patient identified by MRN, date of birth, ID band Patient awake    Reviewed: Allergy & Precautions, NPO status , Patient's Chart, lab work & pertinent test results, reviewed documented beta blocker date and time   Airway Mallampati: II  TM Distance: >3 FB     Dental  (+) Chipped   Pulmonary former smoker,           Cardiovascular      Neuro/Psych PSYCHIATRIC DISORDERS Anxiety Depression Bipolar Disorder    GI/Hepatic   Endo/Other    Renal/GU      Musculoskeletal   Abdominal   Peds  Hematology   Anesthesia Other Findings   Reproductive/Obstetrics                             Anesthesia Physical Anesthesia Plan  ASA: II  Anesthesia Plan: General   Post-op Pain Management:    Induction: Intravenous  Airway Management Planned: Mask  Additional Equipment:   Intra-op Plan:   Post-operative Plan:   Informed Consent: I have reviewed the patients History and Physical, chart, labs and discussed the procedure including the risks, benefits and alternatives for the proposed anesthesia with the patient or authorized representative who has indicated his/her understanding and acceptance.     Plan Discussed with: CRNA  Anesthesia Plan Comments:         Anesthesia Quick Evaluation

## 2015-05-18 NOTE — Procedures (Signed)
ECT SERVICES Physician's Interval Evaluation & Treatment Note  Patient Identification: Michaela [name] MRN:  [id number] Date of Evaluation:  05/18/2015 TX #: 36  MADRS:   MMSE:   P.E. Findings:  No new physical findings or changes  Psychiatric Interval Note:  Patient reports that her mood is been worse especially the last 2 week. Having more crying spells. Lack of motivation. Barely getting out of bed.  Subjective:  Patient is a 39 y.o. female seen for evaluation for Electroconvulsive Therapy. Note above. Physically no other complaint  Treatment Summary:     Right Unilateral              Bilateral   % Energy : 1.0 ms 100%   Impedance: 940 ohms  Seizure Energy Index: 21,354 V squared  Postictal Suppression Index: Not red appropriately  Seizure Concordance Index: 99%  Medications  Pre Shock: Xylocaine 4 mg, Toradol 30 mg, ketamine 100 mg, rocuronium  5 mg, succinylcholine 70 mg  Post Shock: None  Seizure Duration: 18 seconds by EMG 27 seconds by EEG   Comments: Patient will return in 2 weeks. She follows up with her usual outpatient psychiatrist. She has been advised to make in a special priority to get out of bed and take a shower every day.   Lungs:    Clear to auscultation                Other:   Heart:      Regular rhythm              irregular rhythm      Previous H&P reviewed, patient examined and there are NO CHANGES                   Previous H&P reviewed, patient examined and there are changes noted.   Mordecai Rasmussen, MD 9/9/201612:05 PM

## 2015-05-18 NOTE — Anesthesia Postprocedure Evaluation (Signed)
  Anesthesia Post-op Note  Patient: Michaela [name]  Procedure(s) Performed: * No procedures listed *  Anesthesia type:General  Patient location: PACU  Post pain: Pain level controlled  Post assessment: Post-op Vital signs reviewed, Patient's Cardiovascular Status Stable, Respiratory Function Stable, Patent Airway and No signs of Nausea or vomiting  Post vital signs: Reviewed and stable  Last Vitals:  Filed Vitals:   05/18/15 1243  BP: 123/81  Pulse: 82  Temp:   Resp: 18    Level of consciousness: awake, alert  and patient cooperative  Complications: No apparent anesthesia complications

## 2015-05-18 NOTE — Transfer of Care (Signed)
Immediate Anesthesia Transfer of Care Note  Patient: Michaela [name]  Procedure(s) Performed: ECT  Patient Location: PACU  Anesthesia Type:General  Level of Consciousness: sedated  Airway & Oxygen Therapy: Patient Spontanous Breathing and Patient connected to face mask oxygen  Post-op Assessment: Report given to RN  Post vital signs: Reviewed and stable  Last Vitals:  Filed Vitals:   05/18/15 1211  BP: 121/80  Pulse: 79  Temp: 37.4 C  Resp: 14    Complications: No apparent anesthesia complications

## 2015-05-18 NOTE — Anesthesia Procedure Notes (Signed)
Date/Time: 05/18/2015 12:02 PM Performed by: Lily Kocher Pre-anesthesia Checklist: Patient identified, Emergency Drugs available, Suction available and Patient being monitored Patient Re-evaluated:Patient Re-evaluated prior to inductionOxygen Delivery Method: Circle system utilized Preoxygenation: Pre-oxygenation with 100% oxygen Intubation Type: IV induction Ventilation: Mask ventilation without difficulty and Mask ventilation throughout procedure Airway Equipment and Method: Bite block Placement Confirmation: positive ETCO2 Dental Injury: Teeth and Oropharynx as per pre-operative assessment

## 2015-05-30 ENCOUNTER — Other Ambulatory Visit: Payer: Self-pay | Admitting: *Deleted

## 2015-06-01 ENCOUNTER — Encounter: Payer: Self-pay | Admitting: Anesthesiology

## 2015-06-01 ENCOUNTER — Encounter
Admission: RE | Admit: 2015-06-01 | Discharge: 2015-06-01 | Disposition: A | Discharge status: Home / Self Care | Payer: BLUE CROSS/BLUE SHIELD | Source: Ambulatory Visit | Attending: Psychiatry | Admitting: Psychiatry

## 2015-06-01 DIAGNOSIS — F332 Major depressive disorder, recurrent severe without psychotic features: Secondary | ICD-10-CM

## 2015-06-01 DIAGNOSIS — Z87891 Personal history of nicotine dependence: Secondary | ICD-10-CM | POA: Insufficient documentation

## 2015-06-01 DIAGNOSIS — F419 Anxiety disorder, unspecified: Secondary | ICD-10-CM | POA: Diagnosis not present

## 2015-06-01 MED ORDER — KETAMINE HCL 10 MG/ML IJ SOLN
100.0000 mg | Freq: Once | INTRAMUSCULAR | Status: AC
  Administered 2015-06-01: 100 mg via INTRAVENOUS

## 2015-06-01 MED ORDER — SUCCINYLCHOLINE CHLORIDE 20 MG/ML IJ SOLN
70.0000 mg | Freq: Once | INTRAMUSCULAR | Status: AC
  Administered 2015-06-01: 70 mg via INTRAVENOUS

## 2015-06-01 MED ORDER — DEXTROSE 5 % IV SOLN
250.0000 mL | Freq: Once | INTRAVENOUS | Status: AC
  Administered 2015-06-01: 250 mL via INTRAVENOUS

## 2015-06-01 MED ORDER — KETOROLAC TROMETHAMINE 30 MG/ML IJ SOLN
30.0000 mg | Freq: Once | INTRAMUSCULAR | Status: AC
  Administered 2015-06-01: 30 mg via INTRAVENOUS

## 2015-06-01 MED ORDER — LIDOCAINE HCL (CARDIAC) 20 MG/ML IV SOLN
4.0000 mg | Freq: Once | INTRAVENOUS | Status: AC
  Administered 2015-06-01: 4 mg via INTRAVENOUS

## 2015-06-01 MED ORDER — ROCURONIUM BROMIDE 50 MG/5ML IV SOLN
5.0000 mg | Freq: Once | INTRAVENOUS | Status: AC
  Administered 2015-06-01: 5 mg via INTRAVENOUS

## 2015-06-01 NOTE — Transfer of Care (Signed)
Immediate Anesthesia Transfer of Care Note  Patient: Michaela [name]  Procedure(s) Performed: ECT  Patient Location: PACU  Anesthesia Type:General  Level of Consciousness: sedated  Airway & Oxygen Therapy: Patient Spontanous Breathing and Patient connected to face mask oxygen  Post-op Assessment: Report given to RN  Post vital signs: Reviewed and stable  Last Vitals:  Filed Vitals:   06/01/15 1147  BP: 112/80  Pulse: 81  Temp: 37.9 C  Resp: 16    Complications: No apparent anesthesia complications

## 2015-06-01 NOTE — H&P (Signed)
Michaela [name] is an 39 y.o. female.   Chief Complaint: Continued for maintenance right unilateral ECT for treatment of recurrent major depression HPI: Patient feels better since our last visit. Mood is significantly better. Not having crying spells. Minor memory complaints  Past Medical History  Diagnosis Date  . Anxiety   . Depression   . Interstitial cystitis 2008    "from 1 to 10 I'm a 10"  . Sinus symptom     watery, itchy eyes  . Bipolar disorder     Past Surgical History  Procedure Laterality Date  . Bladder stretch      for interstitial cystitis  . Wisdom tooth extraction    . Abdominal hysterectomy    . Breast enhancement surgery Bilateral     Family History  Problem Relation Age of Onset  . Hypotension Neg Hx   . Anesthesia problems Neg Hx   . Malignant hyperthermia Neg Hx   . Pseudochol deficiency Neg Hx   . Hypertension Mother   . Hypertension Father    Social History:  reports that she quit smoking about 16 years ago. Her smoking use included Cigarettes. She has a .25 pack-year smoking history. She has never used smokeless tobacco. She reports that she does not use illicit drugs. Her alcohol history is not on file.  Allergies:  Allergies  Allergen Reactions  . Citrus Other (See Comments)    Causes interstitual cystitis flare up  . Debby Freiberg Er] Swelling and Rash     (Not in a hospital admission)  No results found for this or any previous visit (from the past 48 hour(s)). No results found.  Review of Systems  Constitutional: Negative.   HENT: Negative.   Eyes: Negative.   Respiratory: Negative.   Cardiovascular: Negative.   Gastrointestinal: Negative.   Musculoskeletal: Negative.   Skin: Negative.   Neurological: Negative.   Psychiatric/Behavioral: Positive for memory loss. Negative for depression, suicidal ideas, hallucinations and substance abuse. The patient is not nervous/anxious and does not have insomnia.     Blood  pressure 124/74, pulse 69, temperature 98.2 F (36.8 C), temperature source Oral, resp. rate 18, height  (1.651 m), weight 68.947 kg (152 lb), SpO2 100 %. Physical Exam  Nursing note and vitals reviewed. Constitutional: She appears well-developed and well-nourished.  HENT:  Head: Normocephalic and atraumatic.  Eyes: Conjunctivae are normal. Pupils are equal, round, and reactive to light.  Neck: Normal range of motion.  Cardiovascular: Normal rate, regular rhythm and normal heart sounds.  Exam reveals no friction rub.   No murmur heard. Respiratory: Effort normal and breath sounds normal. No respiratory distress. She has no wheezes. She has no rales.  GI: Soft.  Musculoskeletal: Normal range of motion.  Neurological: She is alert.  Skin: Skin is warm and dry.  Psychiatric: She has a normal mood and affect. Her behavior is normal. Judgment and thought content normal.     Assessment/Plan Doing well. Every other week schedule hopefully will continue to maintain benefit. Plan to return in 2 weeks on October 7  Kelty Szafran 06/01/2015, 11:26 AM

## 2015-06-01 NOTE — Procedures (Signed)
ECT SERVICES Physician's Interval Evaluation & Treatment Note  Patient Identification: Michaela [name] MRN:  [id number] Date of Evaluation:  06/01/2015 TX #: 37  MADRS:   MMSE:   P.E. Findings:  No change to physical exam. Vitals stable. No new findings  Psychiatric Interval Note:  Mood is feeling better. Less tearful. More upbeat more active  Subjective:  Patient is a 39 y.o. female seen for evaluation for Electroconvulsive Therapy. Minimal memory complaints functionality improving no new complaints  Treatment Summary:     Right Unilateral              Bilateral   % Energy : 1.0 ms, 100%   Impedance: 1200 ohms  Seizure Energy Index: 25,862 V squared  Postictal Suppression Index: 98%  Seizure Concordance Index: 98%  Medications  rocuronium 5 mg, succinylcholine 70 mg  Post Shock: None  Seizure Duration: 18 seconds by EMG, 23 seconds by EEG   Comments: Follow-up 2 weeks October 7   Lungs:    Clear to auscultation                Other:   Heart:      Regular rhythm              irregular rhythm      Previous H&P reviewed, patient examined and there are NO CHANGES                   Previous H&P reviewed, patient examined and there are changes noted.   Mordecai Rasmussen, MD 9/23/201611:29 AM

## 2015-06-01 NOTE — Anesthesia Postprocedure Evaluation (Signed)
  Anesthesia Post-op Note  Patient: Michaela [name]  Procedure(s) Performed: * No procedures listed *  Anesthesia type:General  Patient location: PACU  Post pain: Pain level controlled  Post assessment: Post-op Vital signs reviewed, Patient's Cardiovascular Status Stable, Respiratory Function Stable, Patent Airway and No signs of Nausea or vomiting  Post vital signs: Reviewed and stable  Last Vitals:  Filed Vitals:   06/01/15 1228  BP: 116/76  Pulse:   Temp:   Resp: 16    Level of consciousness: awake, alert  and patient cooperative  Complications: No apparent anesthesia complications

## 2015-06-01 NOTE — Anesthesia Preprocedure Evaluation (Signed)
Anesthesia Evaluation  Patient identified by MRN, date of birth, ID band Patient awake    Reviewed: Allergy & Precautions, H&P , NPO status , Patient's Chart, lab work & pertinent test results, reviewed documented beta blocker date and time   Airway Mallampati: II  TM Distance: >3 FB     Dental  (+) Chipped   Pulmonary former smoker,           Cardiovascular Exercise Tolerance: Good      Neuro/Psych PSYCHIATRIC DISORDERS Anxiety Depression Bipolar Disorder    GI/Hepatic neg GERD  ,  Endo/Other    Renal/GU      Musculoskeletal   Abdominal   Peds  Hematology   Anesthesia Other Findings Past Medical History:   Anxiety                                                      Depression                                                   Interstitial cystitis                           2008           Comment:"from 0 to 10 I'm a 10"   Sinus symptom                                                  Comment:watery, itchy eyes   Bipolar disorder                                             Reproductive/Obstetrics                             Anesthesia Physical  Anesthesia Plan  ASA: III  Anesthesia Plan: General   Post-op Pain Management:    Induction: Intravenous  Airway Management Planned: Mask  Additional Equipment:   Intra-op Plan:   Post-operative Plan:   Informed Consent: I have reviewed the patients History and Physical, chart, labs and discussed the procedure including the risks, benefits and alternatives for the proposed anesthesia with the patient or authorized representative who has indicated his/her understanding and acceptance.     Plan Discussed with: CRNA  Anesthesia Plan Comments:         Anesthesia Quick Evaluation

## 2015-06-01 NOTE — Anesthesia Procedure Notes (Signed)
Date/Time: 06/01/2015 11:35 AM Performed by: Lily Kocher Pre-anesthesia Checklist: Patient identified, Emergency Drugs available, Suction available and Patient being monitored Patient Re-evaluated:Patient Re-evaluated prior to inductionOxygen Delivery Method: Circle system utilized Preoxygenation: Pre-oxygenation with 100% oxygen Intubation Type: IV induction Ventilation: Mask ventilation without difficulty and Mask ventilation throughout procedure Airway Equipment and Method: Bite block Placement Confirmation: positive ETCO2 Dental Injury: Teeth and Oropharynx as per pre-operative assessment

## 2015-06-13 ENCOUNTER — Other Ambulatory Visit: Payer: Self-pay | Admitting: *Deleted

## 2015-06-15 ENCOUNTER — Encounter: Payer: Self-pay | Admitting: Anesthesiology

## 2015-06-15 ENCOUNTER — Encounter
Admission: RE | Admit: 2015-06-15 | Discharge: 2015-06-15 | Disposition: A | Discharge status: Home / Self Care | Payer: BLUE CROSS/BLUE SHIELD | Source: Ambulatory Visit | Attending: Psychiatry | Admitting: Psychiatry

## 2015-06-15 ENCOUNTER — Encounter: Payer: BLUE CROSS/BLUE SHIELD | Admitting: Anesthesiology

## 2015-06-15 DIAGNOSIS — Z87891 Personal history of nicotine dependence: Secondary | ICD-10-CM | POA: Diagnosis not present

## 2015-06-15 DIAGNOSIS — F419 Anxiety disorder, unspecified: Secondary | ICD-10-CM | POA: Diagnosis not present

## 2015-06-15 DIAGNOSIS — F319 Bipolar disorder, unspecified: Secondary | ICD-10-CM | POA: Diagnosis not present

## 2015-06-15 DIAGNOSIS — F332 Major depressive disorder, recurrent severe without psychotic features: Secondary | ICD-10-CM | POA: Insufficient documentation

## 2015-06-15 MED ORDER — DEXTROSE 5 % IV SOLN
250.0000 mL | Freq: Once | INTRAVENOUS | Status: AC
  Administered 2015-06-15: 250 mL via INTRAVENOUS

## 2015-06-15 MED ORDER — SUCCINYLCHOLINE CHLORIDE 20 MG/ML IJ SOLN
70.0000 mg | Freq: Once | INTRAMUSCULAR | Status: AC
  Administered 2015-06-15: 70 mg via INTRAVENOUS

## 2015-06-15 MED ORDER — LIDOCAINE HCL (CARDIAC) 20 MG/ML IV SOLN
4.0000 mg | Freq: Once | INTRAVENOUS | Status: AC
  Administered 2015-06-15: 4 mg via INTRAVENOUS

## 2015-06-15 MED ORDER — KETAMINE HCL 10 MG/ML IJ SOLN: 100.0000 mg | Freq: Once | INTRAMUSCULAR | Status: DC

## 2015-06-15 MED ORDER — ROCURONIUM BROMIDE 50 MG/5ML IV SOLN
5.0000 mg | Freq: Once | INTRAVENOUS | Status: AC
  Administered 2015-06-15: 5 mg via INTRAVENOUS

## 2015-06-15 MED ORDER — SODIUM CHLORIDE 0.9 % IV SOLN
INTRAVENOUS | Status: DC | PRN
  Administered 2015-06-15: 12:00:00 via INTRAVENOUS

## 2015-06-15 MED ORDER — KETAMINE HCL 10 MG/ML IJ SOLN
100.0000 mg | Freq: Once | INTRAMUSCULAR | Status: AC
  Administered 2015-06-15: 100 mg via INTRAVENOUS

## 2015-06-15 MED ORDER — KETOROLAC TROMETHAMINE 30 MG/ML IJ SOLN
30.0000 mg | Freq: Once | INTRAMUSCULAR | Status: AC
  Administered 2015-06-15: 30 mg via INTRAVENOUS

## 2015-06-15 NOTE — Anesthesia Postprocedure Evaluation (Signed)
  Anesthesia Post-op Note  Patient: Michaela [name]  Procedure(s) Performed: * No procedures listed *  Anesthesia type:General  Patient location: PACU  Post pain: Pain level controlled  Post assessment: Post-op Vital signs reviewed, Patient's Cardiovascular Status Stable, Respiratory Function Stable, Patent Airway and No signs of Nausea or vomiting  Post vital signs: Reviewed and stable  Last Vitals:  Filed Vitals:   06/15/15 1240  BP:   Pulse: 79  Temp: 37.2 C  Resp: 16    Level of consciousness: awake, alert  and patient cooperative  Complications: No apparent anesthesia complications

## 2015-06-15 NOTE — Addendum Note (Signed)
Addendum  created 06/15/15 1320 by Junious Silk, CRNA   Modules edited: Charges VN

## 2015-06-15 NOTE — Transfer of Care (Signed)
Immediate Anesthesia Transfer of Care Note  Patient: Michaela [name]  Procedure(s) Performed: * No procedures listed *  Patient Location: PACU  Anesthesia Type:General  Level of Consciousness: sedated  Airway & Oxygen Therapy: Patient Spontanous Breathing and Patient connected to face mask oxygen  Post-op Assessment: Report given to RN and Post -op Vital signs reviewed and stable  Post vital signs: Reviewed and stable  Last Vitals:  Filed Vitals:   06/15/15 1041  BP: 118/81  Pulse: 79  Temp: 36.6 C  Resp: 16    Complications: No apparent anesthesia complications

## 2015-06-15 NOTE — Procedures (Signed)
ECT SERVICES Physician's Interval Evaluation & Treatment Note  Patient Identification: DARRIEL SINQUEFIELD MRN:  [id number] Date of Evaluation:  06/15/2015 TX #: 38  MADRS: 22  MMSE: 28  P.E. Findings:  No change to physical exam findings  Psychiatric Interval Note:  Mood is improved although she still has memory impairment  Subjective:  Patient is a 39 y.o. female seen for evaluation for Electroconvulsive Therapy. Generally feeling improved and more stable. She is requesting a four-week gap in treatment to allow for more memory improvement.  Treatment Summary:     Right Unilateral              Bilateral   % Energy : 1.0 ms 100%   Impedance: 980 ohms  Seizure Energy Index: 40,805 V squared  Postictal Suppression Index: 98%  Seizure Concordance Index: 98%  Medications  Pre Shock: Xylocaine 4 mg, rocuronium 5 mg, Toradol 30 mg, ketamine 100 mg, succinylcholine 70 mg  Post Shock: None  Seizure Duration: 15 seconds by EMG, 22 seconds by EEG   Comments: Follow-up scheduled for November 4   Lungs:    Clear to auscultation                Other:   Heart:      Regular rhythm              irregular rhythm      Previous H&P reviewed, patient examined and there are NO CHANGES                   Previous H&P reviewed, patient examined and there are changes noted.   Mordecai Rasmussen, MD 10/7/201612:26 PM

## 2015-06-15 NOTE — Anesthesia Preprocedure Evaluation (Signed)
Anesthesia Evaluation  Patient identified by MRN, date of birth, ID band Patient awake    Reviewed: Allergy & Precautions, H&P , NPO status , Patient's Chart, lab work & pertinent test results, reviewed documented beta blocker date and time   Airway Mallampati: II  TM Distance: >3 FB Neck ROM: full    Dental no notable dental hx.    Pulmonary neg pulmonary ROS, former smoker,    Pulmonary exam normal breath sounds clear to auscultation       Cardiovascular Exercise Tolerance: Good negative cardio ROS   Rhythm:regular Rate:Normal     Neuro/Psych PSYCHIATRIC DISORDERS negative neurological ROS  negative psych ROS   GI/Hepatic negative GI ROS, Neg liver ROS,   Endo/Other  negative endocrine ROS  Renal/GU negative Renal ROS  negative genitourinary   Musculoskeletal   Abdominal   Peds  Hematology negative hematology ROS (+)   Anesthesia Other Findings   Reproductive/Obstetrics negative OB ROS                             Anesthesia Physical Anesthesia Plan  ASA: II  Anesthesia Plan: General   Post-op Pain Management:    Induction:   Airway Management Planned:   Additional Equipment:   Intra-op Plan:   Post-operative Plan:   Informed Consent: I have reviewed the patients History and Physical, chart, labs and discussed the procedure including the risks, benefits and alternatives for the proposed anesthesia with the patient or authorized representative who has indicated his/her understanding and acceptance.   Dental Advisory Given  Plan Discussed with: CRNA  Anesthesia Plan Comments:         Anesthesia Quick Evaluation  

## 2015-06-15 NOTE — Anesthesia Procedure Notes (Signed)
Date/Time: 06/15/2015 12:28 PM Performed by: Junious Silk Pre-anesthesia Checklist: Patient identified, Timeout performed, Emergency Drugs available, Suction available and Patient being monitored Patient Re-evaluated:Patient Re-evaluated prior to inductionOxygen Delivery Method: Ambu bag

## 2015-06-15 NOTE — H&P (Signed)
Michaela [name] is an 39 y.o. female.   Chief Complaint: Patient is getting maintenance treatment for ECT. No new specific complaint. Mood is improving. Mild memory impairment. HPI: Since last treatment has had some improvement with mood although she is still concerned about her memory impairment  Past Medical History  Diagnosis Date  . Anxiety   . Depression   . Interstitial cystitis 2008    "from 0 to 10 I'm a 10"  . Sinus symptom     watery, itchy eyes  . Bipolar disorder [facility])     Past Surgical History  Procedure Laterality Date  . Bladder stretch      for interstitial cystitis  . Wisdom tooth extraction    . Abdominal hysterectomy    . Breast enhancement surgery Bilateral     Family History  Problem Relation Age of Onset  . Hypotension Neg Hx   . Anesthesia problems Neg Hx   . Malignant hyperthermia Neg Hx   . Pseudochol deficiency Neg Hx   . Hypertension Mother   . Hypertension Father    Social History:  reports that she quit smoking about 16 years ago. Her smoking use included Cigarettes. She has a .25 pack-year smoking history. She has never used smokeless tobacco. She reports that she does not use illicit drugs. Her alcohol history is not on file.  Allergies:  Allergies  Allergen Reactions  . Citrus Other (See Comments)    Causes interstitual cystitis flare up  . Debby Freiberg Er] Swelling and Rash     (Not in a hospital admission)  No results found for this or any previous visit (from the past 48 hour(s)). No results found.  Review of Systems  Constitutional: Negative.   HENT: Negative.   Eyes: Negative.   Respiratory: Negative.   Cardiovascular: Negative.   Gastrointestinal: Negative.   Musculoskeletal: Negative.   Skin: Negative.   Neurological: Negative.   Psychiatric/Behavioral: Positive for memory loss. Negative for depression, suicidal ideas, hallucinations and substance abuse. The patient is not nervous/anxious and does not have  insomnia.     Blood pressure 118/81, pulse 79, temperature 97.9 F (36.6 C), temperature source Tympanic, resp. rate 16, height  (1.651 m), weight 68.493 kg (151 lb), SpO2 100 %. Physical Exam  Nursing note and vitals reviewed. Constitutional: She appears well-developed and well-nourished.  HENT:  Head: Normocephalic and atraumatic.  Eyes: Conjunctivae are normal. Pupils are equal, round, and reactive to light.  Neck: Normal range of motion.  Cardiovascular: Normal rate, regular rhythm and normal heart sounds.   Respiratory: Effort normal and breath sounds normal. No respiratory distress. She has no wheezes. She has no rales.  GI: Soft.  Musculoskeletal: Normal range of motion.  Neurological: She is alert.  Skin: Skin is warm and dry.  Psychiatric: She has a normal mood and affect. Her behavior is normal. Judgment and thought content normal.     Assessment/Plan Patient requests that we do a four-week gap in treatment which seems fine and appropriate.  Jia Dottavio 06/15/2015, 12:25 PM

## 2015-06-23 IMAGING — CR DG CHEST 2V
1 series · 2 of 2 positions shown · non-contrast
Comparison: Chest x-ray 03/23/2014.

CLINICAL DATA: ECT workup.

EXAM:
CHEST  2 VIEW

[Series 1: w chest pa · 0.14mm/px · 2 of 2 slices shown]
[im 1/2]
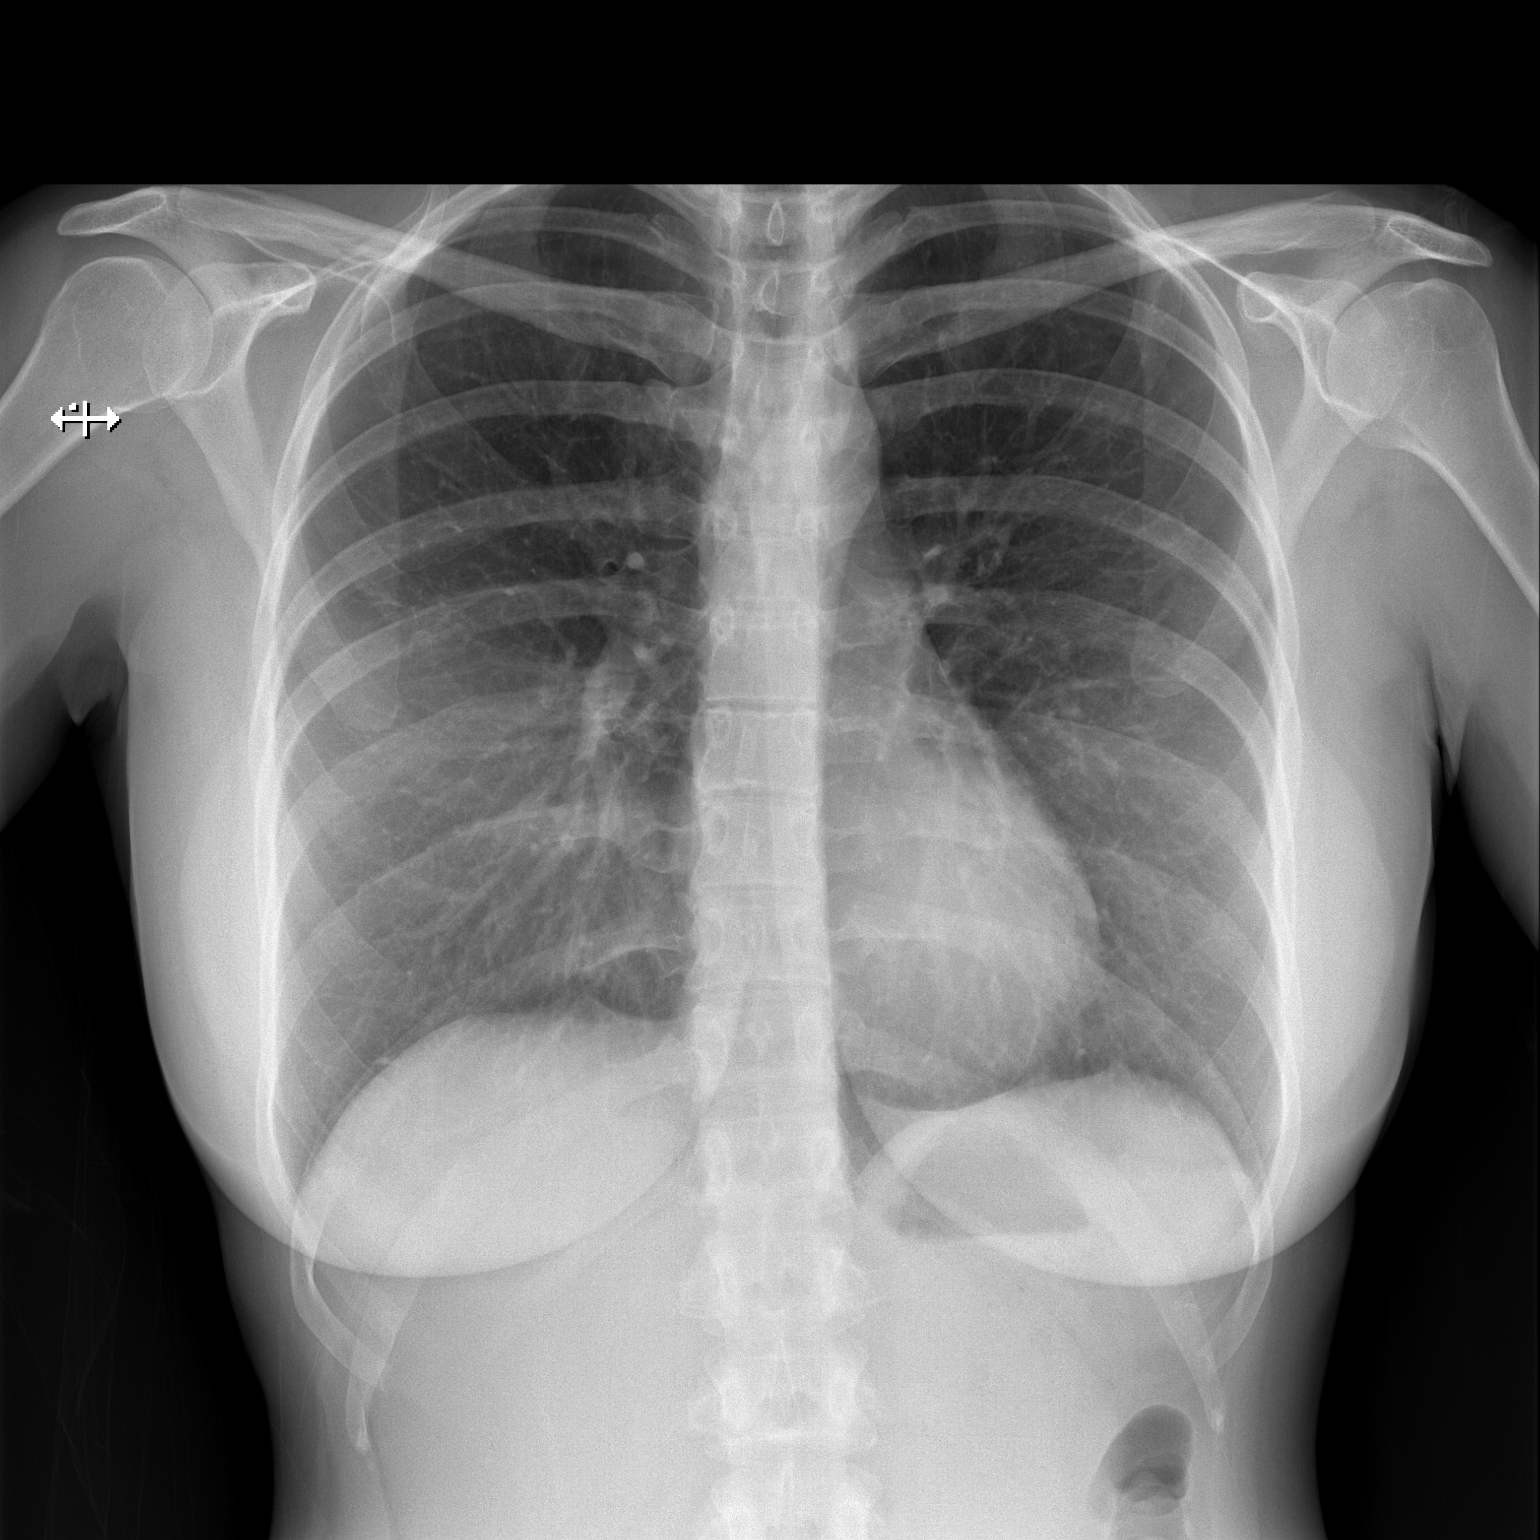
[im 2/2]
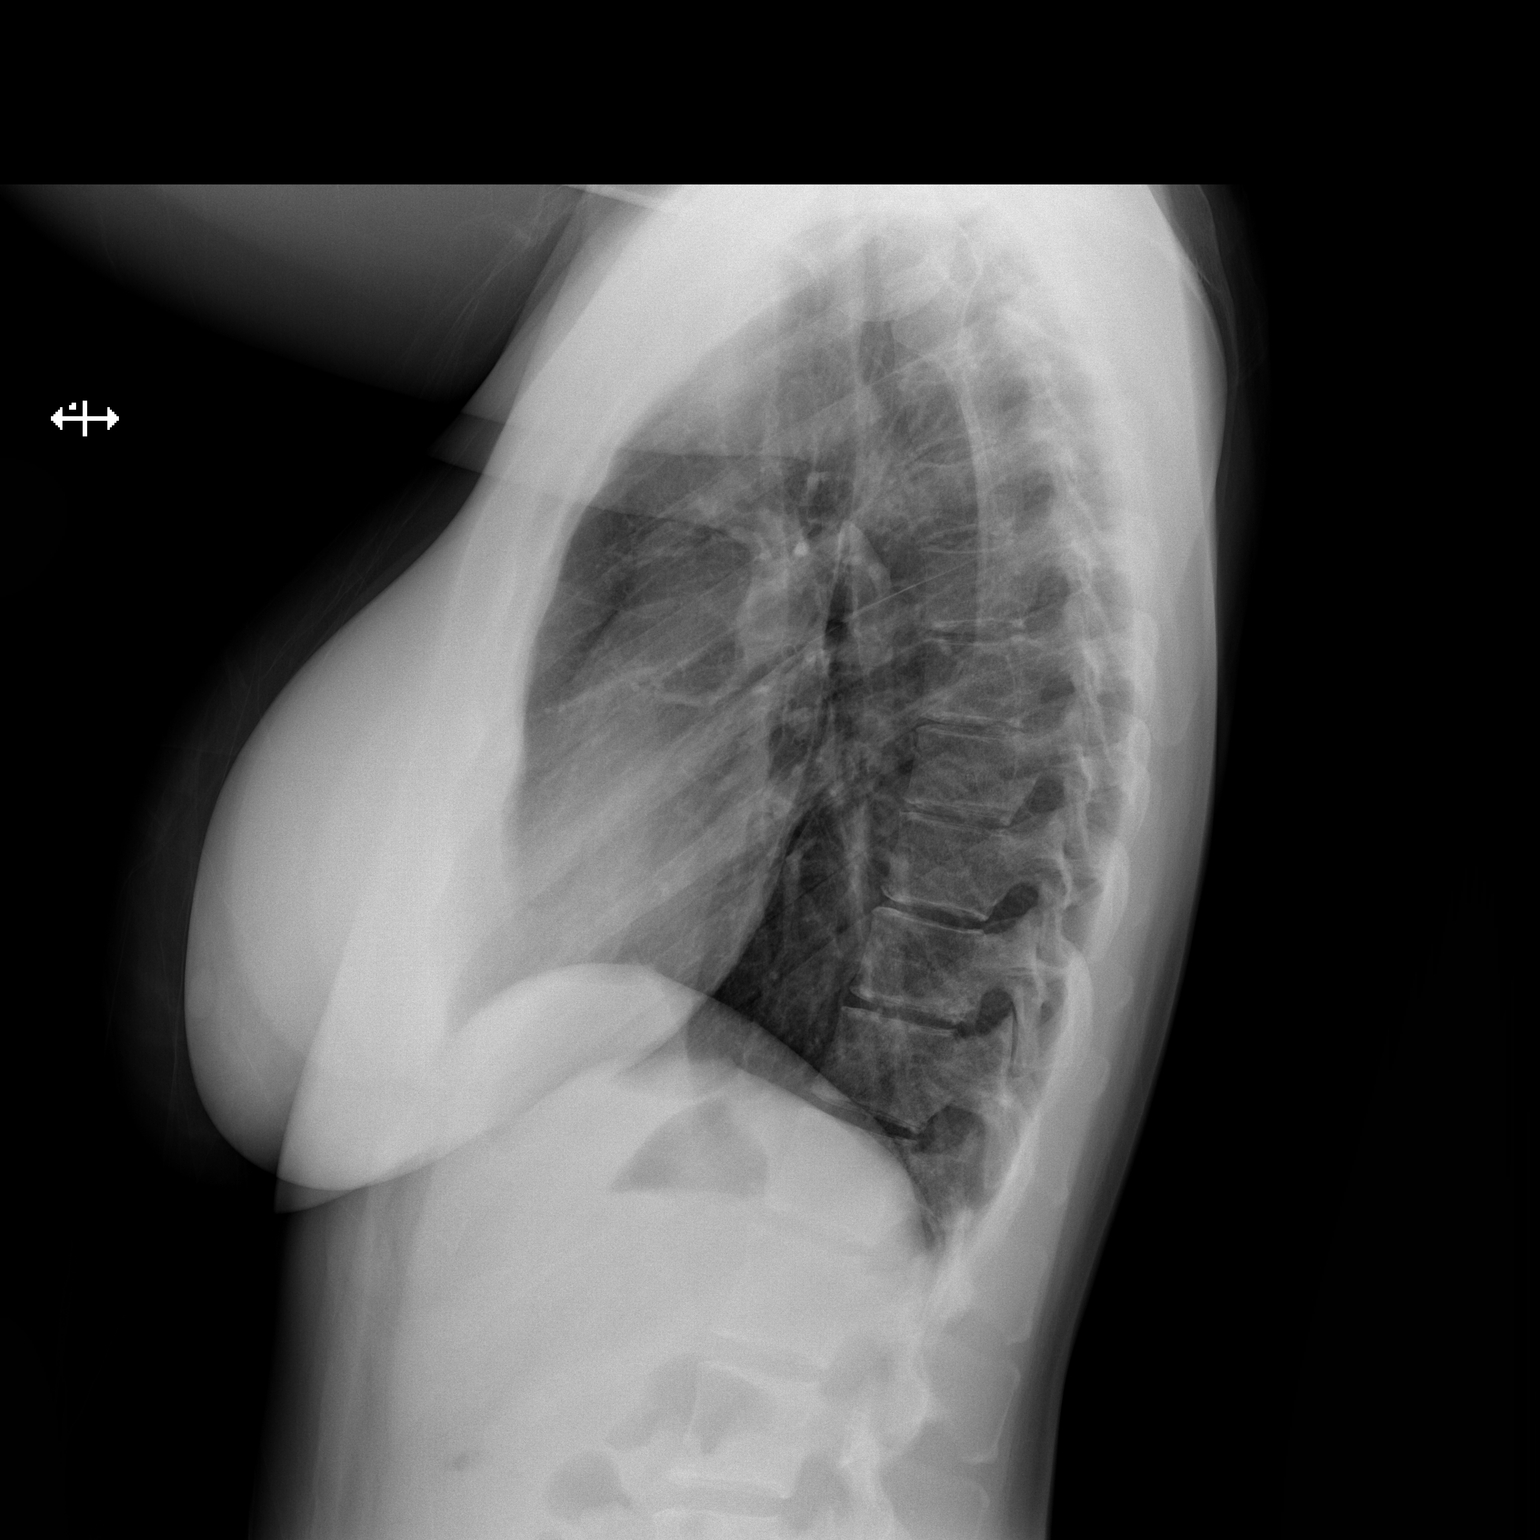

[2 of 2 positions shown; findings below may reference images not displayed]

FINDINGS: Lung volumes are normal. No consolidative airspace disease. No
pleural effusions. No pneumothorax. No pulmonary nodule or mass
noted. Pulmonary vasculature and the cardiomediastinal silhouette
are within normal limits.
IMPRESSION: No radiographic evidence of acute cardiopulmonary disease.

## 2015-06-27 ENCOUNTER — Telehealth: Payer: Self-pay | Admitting: Neurology

## 2015-06-27 ENCOUNTER — Ambulatory Visit: Payer: BLUE CROSS/BLUE SHIELD | Admitting: Neurology

## 2015-06-27 NOTE — Telephone Encounter (Signed)
This patient did not show for a new patient appointment today. 

## 2015-06-28 ENCOUNTER — Encounter: Payer: Self-pay | Admitting: Neurology

## 2015-07-06 ENCOUNTER — Other Ambulatory Visit: Payer: Self-pay | Admitting: *Deleted

## 2015-07-13 ENCOUNTER — Encounter: Payer: Self-pay | Admitting: *Deleted

## 2015-07-13 ENCOUNTER — Encounter
Admission: RE | Admit: 2015-07-13 | Discharge: 2015-07-13 | Disposition: A | Discharge status: Home / Self Care | Payer: BLUE CROSS/BLUE SHIELD | Source: Ambulatory Visit | Attending: Psychiatry | Admitting: Psychiatry

## 2015-07-13 DIAGNOSIS — Z9889 Other specified postprocedural states: Secondary | ICD-10-CM | POA: Insufficient documentation

## 2015-07-13 DIAGNOSIS — F319 Bipolar disorder, unspecified: Secondary | ICD-10-CM | POA: Diagnosis not present

## 2015-07-13 DIAGNOSIS — F419 Anxiety disorder, unspecified: Secondary | ICD-10-CM | POA: Diagnosis not present

## 2015-07-13 DIAGNOSIS — F332 Major depressive disorder, recurrent severe without psychotic features: Secondary | ICD-10-CM | POA: Insufficient documentation

## 2015-07-13 DIAGNOSIS — Z87891 Personal history of nicotine dependence: Secondary | ICD-10-CM | POA: Diagnosis not present

## 2015-07-13 MED ORDER — ROCURONIUM BROMIDE 50 MG/5ML IV SOLN
5.0000 mg | Freq: Once | INTRAVENOUS | Status: AC
  Administered 2015-07-13: 5 mg via INTRAVENOUS

## 2015-07-13 MED ORDER — SUCCINYLCHOLINE CHLORIDE 20 MG/ML IJ SOLN
70.0000 mg | Freq: Once | INTRAMUSCULAR | Status: AC
  Administered 2015-07-13: 70 mg via INTRAVENOUS

## 2015-07-13 MED ORDER — KETOROLAC TROMETHAMINE 30 MG/ML IJ SOLN
30.0000 mg | Freq: Once | INTRAMUSCULAR | Status: AC
  Administered 2015-07-13: 30 mg via INTRAVENOUS

## 2015-07-13 MED ORDER — DEXTROSE 5 % IV SOLN
250.0000 mL | Freq: Once | INTRAVENOUS | Status: AC
  Administered 2015-07-13: 250 mL via INTRAVENOUS

## 2015-07-13 MED ORDER — LIDOCAINE HCL (CARDIAC) 20 MG/ML IV SOLN
4.0000 mg | Freq: Once | INTRAVENOUS | Status: AC
  Administered 2015-07-13: 4 mg via INTRAVENOUS

## 2015-07-13 MED ORDER — KETAMINE HCL 10 MG/ML IJ SOLN
100.0000 mg | Freq: Once | INTRAMUSCULAR | Status: AC
  Administered 2015-07-13: 100 mg via INTRAVENOUS

## 2015-07-13 NOTE — Anesthesia Preprocedure Evaluation (Signed)
Anesthesia Evaluation  Patient identified by MRN, date of birth, ID band Patient awake    Reviewed: Allergy & Precautions, NPO status , Patient's Chart, lab work & pertinent test results  Airway Mallampati: II  TM Distance: >3 FB Neck ROM: Full    Dental  (+) Teeth Intact   Pulmonary former smoker,    Pulmonary exam normal        Cardiovascular Exercise Tolerance: Good negative cardio ROS Normal cardiovascular exam     Neuro/Psych    GI/Hepatic negative GI ROS, Neg liver ROS,   Endo/Other  negative endocrine ROS  Renal/GU negative Renal ROS     Musculoskeletal   Abdominal Normal abdominal exam  (+)  Abdomen: soft.    Peds  Hematology   Anesthesia Other Findings   Reproductive/Obstetrics                             Anesthesia Physical Anesthesia Plan  ASA: II  Anesthesia Plan: General   Post-op Pain Management:    Induction: Intravenous  Airway Management Planned: Mask  Additional Equipment:   Intra-op Plan:   Post-operative Plan:   Informed Consent: I have reviewed the patients History and Physical, chart, labs and discussed the procedure including the risks, benefits and alternatives for the proposed anesthesia with the patient or authorized representative who has indicated his/her understanding and acceptance.     Plan Discussed with: CRNA  Anesthesia Plan Comments:         Anesthesia Quick Evaluation

## 2015-07-13 NOTE — Transfer of Care (Signed)
Immediate Anesthesia Transfer of Care Note  Patient: Michaela StallingKimberly C Meng  Procedure(s) Performed: ECT  Patient Location: PACU  Anesthesia Type:General  Level of Consciousness: sedated  Airway & Oxygen Therapy: Patient Spontanous Breathing and Patient connected to face mask oxygen  Post-op Assessment: Report given to RN and Post -op Vital signs reviewed and stable  Post vital signs: Reviewed and stable  Last Vitals:  Filed Vitals:   07/13/15 1152  BP: 119/78  Pulse: 96  Temp: 37.1 C  Resp: 16    Complications: No apparent anesthesia complications

## 2015-07-13 NOTE — Procedures (Signed)
ECT SERVICES Physician's Interval Evaluation & Treatment Note  Patient Identification: Michaela [name] MRN:  [id number] Date of Evaluation:  07/13/2015 TX #: 39  MADRS:   MMSE:   P.E. Findings:  No change to physical exam no findings  Psychiatric Interval Note:  Mood is doing well, no New complaints since last time  Subjective:  Patient is a 39 y.o. female seen for evaluation for Electroconvulsive Therapy. No complaints  Treatment Summary:   []   Right Unilateral             [x]  Bilateral   % Energy : 1.0 ms 100%   Impedance: 860 ohms  Seizure Energy Index: 32,247 V squared  Postictal Suppression Index: 97%  Seizure Concordance Index: 99%  Medications  Pre Shock: Xylocaine 4 mg, Toradol 30 mg, ketamine 100 mg, rocuronium 5 mg, succinylcholine 70 mg None    Post Shock: None  Seizure Duration: 32 seconds by EMG, 32 seconds by EEG   Comments: Follow-up 4 weeks which will be December 2   Lungs:  [x]   Clear to auscultation               []  Other:   Heart:    [x]   Regular rhythm             []  irregular rhythm    [x]   Previous H&P reviewed, patient examined and there are NO CHANGES                 []   Previous H&P reviewed, patient examined and there are changes noted.   Mordecai RasmussenJohn Julita Ozbun, MD 11/4/201611:35 AM

## 2015-07-13 NOTE — Anesthesia Postprocedure Evaluation (Signed)
  Anesthesia Post-op Note  Patient: Michaela [name]  Procedure(s) Performed: * No procedures listed *  Anesthesia type:General  Patient location: PACU  Post pain: Pain level controlled  Post assessment: Post-op Vital signs reviewed, Patient's Cardiovascular Status Stable, Respiratory Function Stable, Patent Airway and No signs of Nausea or vomiting  Post vital signs: Reviewed and stable  Last Vitals:  Filed Vitals:   07/13/15 1152  BP: 119/78  Pulse: 93  Temp: 37.1 C  Resp: 16    Level of consciousness: awake, alert  and patient cooperative  Complications: No apparent anesthesia complications

## 2015-07-13 NOTE — H&P (Signed)
Michaela [name] is an 39 y.o. female.   Chief Complaint: No new complaint HPI: Maintenance ECT. Doing well since last treatment  Past Medical History  Diagnosis Date  . Anxiety   . Depression   . Interstitial cystitis 2008    "from 0 to 10 I'm a 10"  . Sinus symptom     watery, itchy eyes  . Bipolar disorder [facility])     Past Surgical History  Procedure Laterality Date  . Bladder stretch      for interstitial cystitis  . Wisdom tooth extraction    . Abdominal hysterectomy    . Breast enhancement surgery Bilateral     Family History  Problem Relation Age of Onset  . Hypotension Neg Hx   . Anesthesia problems Neg Hx   . Malignant hyperthermia Neg Hx   . Pseudochol deficiency Neg Hx   . Hypertension Mother   . Hypertension Father    Social History:  reports that she quit smoking about 16 years ago. Her smoking use included Cigarettes. She has a .25 pack-year smoking history. She has never used smokeless tobacco. She reports that she does not use illicit drugs. Her alcohol history is not on file.  Allergies:  Allergies  Allergen Reactions  . Citrus Other (See Comments)    Causes interstitual cystitis flare up  . Debby FreibergEquetro [Carbamazepine Er] Swelling and Rash     (Not in a hospital admission)  No results found for this or any previous visit (from the past 48 hour(s)). No results found.  Review of Systems  Constitutional: Negative.   HENT: Negative.   Eyes: Negative.   Respiratory: Negative.   Cardiovascular: Negative.   Gastrointestinal: Negative.   Musculoskeletal: Negative.   Skin: Negative.   Neurological: Negative.     Blood pressure 125/80, pulse 82, temperature 96.2 F (35.7 C), temperature source Oral, resp. rate 18, height 5\' 5"  (1.651 m), weight 69.4 kg (153 lb). Physical Exam  Nursing note and vitals reviewed. Constitutional: She appears well-developed and well-nourished.  HENT:  Head: Normocephalic and atraumatic.  Eyes: Conjunctivae are normal.  Pupils are equal, round, and reactive to light.  Neck: Normal range of motion.  Cardiovascular: Regular rhythm and normal heart sounds.   Respiratory: Effort normal and breath sounds normal. No respiratory distress. She has no wheezes.  GI: Soft.  Musculoskeletal: Normal range of motion.  Neurological: She is alert.  Skin: Skin is warm and dry.  Psychiatric: She has a normal mood and affect. Her behavior is normal. Judgment and thought content normal.     Assessment/Plan Follow-up in 4 weeks  John Clapacs 07/13/2015, 11:34 AM

## 2015-07-13 NOTE — Anesthesia Procedure Notes (Signed)
Date/Time: 07/13/2015 11:41 AM Performed by: Lily KocherPERALTA, Michaela Sieg Pre-anesthesia Checklist: Patient identified, Emergency Drugs available, Suction available and Patient being monitored Patient Re-evaluated:Patient Re-evaluated prior to inductionOxygen Delivery Method: Circle system utilized Preoxygenation: Pre-oxygenation with 100% oxygen Intubation Type: IV induction Ventilation: Mask ventilation without difficulty and Mask ventilation throughout procedure Airway Equipment and Method: Bite block Placement Confirmation: positive ETCO2 Dental Injury: Teeth and Oropharynx as per pre-operative assessment

## 2015-08-10 ENCOUNTER — Encounter: Payer: Self-pay | Admitting: Anesthesiology

## 2015-08-10 ENCOUNTER — Encounter
Admission: RE | Admit: 2015-08-10 | Discharge: 2015-08-10 | Disposition: A | Discharge status: Home / Self Care | Payer: BLUE CROSS/BLUE SHIELD | Source: Ambulatory Visit | Attending: Psychiatry | Admitting: Psychiatry

## 2015-08-10 DIAGNOSIS — F332 Major depressive disorder, recurrent severe without psychotic features: Secondary | ICD-10-CM | POA: Diagnosis not present

## 2015-08-10 DIAGNOSIS — F419 Anxiety disorder, unspecified: Secondary | ICD-10-CM | POA: Diagnosis not present

## 2015-08-10 DIAGNOSIS — Z87891 Personal history of nicotine dependence: Secondary | ICD-10-CM | POA: Insufficient documentation

## 2015-08-10 DIAGNOSIS — Z91018 Allergy to other foods: Secondary | ICD-10-CM | POA: Diagnosis not present

## 2015-08-10 DIAGNOSIS — Z888 Allergy status to other drugs, medicaments and biological substances status: Secondary | ICD-10-CM | POA: Diagnosis not present

## 2015-08-10 DIAGNOSIS — F319 Bipolar disorder, unspecified: Secondary | ICD-10-CM | POA: Insufficient documentation

## 2015-08-10 MED ORDER — KETOROLAC TROMETHAMINE 30 MG/ML IJ SOLN
30.0000 mg | Freq: Once | INTRAMUSCULAR | Status: AC
  Administered 2015-08-10: 30 mg via INTRAVENOUS

## 2015-08-10 MED ORDER — DEXTROSE 5 % IV SOLN
250.0000 mL | Freq: Once | INTRAVENOUS | Status: AC
  Administered 2015-08-10: 250 mL via INTRAVENOUS

## 2015-08-10 MED ORDER — SUCCINYLCHOLINE CHLORIDE 20 MG/ML IJ SOLN
70.0000 mg | Freq: Once | INTRAMUSCULAR | Status: AC
  Administered 2015-08-10: 70 mg via INTRAVENOUS

## 2015-08-10 MED ORDER — ROCURONIUM BROMIDE 50 MG/5ML IV SOLN
5.0000 mg | Freq: Once | INTRAVENOUS | Status: AC
Start: 2015-08-10 — End: 2015-08-10
  Administered 2015-08-10: 5 mg via INTRAVENOUS

## 2015-08-10 MED ORDER — LIDOCAINE HCL (CARDIAC) 20 MG/ML IV SOLN
4.0000 mg | Freq: Once | INTRAVENOUS | Status: AC
  Administered 2015-08-10: 4 mg via INTRAVENOUS

## 2015-08-10 MED ORDER — KETAMINE HCL 10 MG/ML IJ SOLN
100.0000 mg | Freq: Once | INTRAMUSCULAR | Status: AC
  Administered 2015-08-10: 100 mg via INTRAVENOUS

## 2015-08-10 NOTE — Anesthesia Preprocedure Evaluation (Signed)
Anesthesia Evaluation  Patient identified by MRN, date of birth, ID band Patient awake    Reviewed: Allergy & Precautions, NPO status , Patient's Chart, lab work & pertinent test results  Airway Mallampati: II  TM Distance: >3 FB Neck ROM: Full    Dental  (+) Teeth Intact   Pulmonary former smoker,    Pulmonary exam normal        Cardiovascular Exercise Tolerance: Good negative cardio ROS Normal cardiovascular exam     Neuro/Psych    GI/Hepatic negative GI ROS, Neg liver ROS,   Endo/Other  negative endocrine ROS  Renal/GU negative Renal ROS     Musculoskeletal   Abdominal Normal abdominal exam  (+)  Abdomen: soft.    Peds  Hematology   Anesthesia Other Findings   Reproductive/Obstetrics                             Anesthesia Physical Anesthesia Plan  ASA: II  Anesthesia Plan: General   Post-op Pain Management:    Induction: Intravenous  Airway Management Planned: Mask  Additional Equipment:   Intra-op Plan:   Post-operative Plan:   Informed Consent: I have reviewed the patients History and Physical, chart, labs and discussed the procedure including the risks, benefits and alternatives for the proposed anesthesia with the patient or authorized representative who has indicated his/her understanding and acceptance.     Plan Discussed with: CRNA  Anesthesia Plan Comments:         Anesthesia Quick Evaluation  

## 2015-08-10 NOTE — H&P (Signed)
Isabella StallingKimberly C Kotas is an 39 y.o. female.   Chief Complaint: With recurrent severe major depression. Follow-up maintenance treatment. No new complaint. Mood and symptoms of depression stable. HPI: No change in mood symptoms or medications since last evaluation no new physical problems  Past Medical History  Diagnosis Date  . Anxiety   . Depression   . Interstitial cystitis 2008    "from 1 to 10 I'm a 10"  . Sinus symptom     watery, itchy eyes  . Bipolar disorder [facility](HCC)     Past Surgical History  Procedure Laterality Date  . Bladder stretch      for interstitial cystitis  . Wisdom tooth extraction    . Abdominal hysterectomy    . Breast enhancement surgery Bilateral     Family History  Problem Relation Age of Onset  . Hypotension Neg Hx   . Anesthesia problems Neg Hx   . Malignant hyperthermia Neg Hx   . Pseudochol deficiency Neg Hx   . Hypertension Mother   . Hypertension Father    Social History:  reports that she quit smoking about 16 years ago. Her smoking use included Cigarettes. She has a .25 pack-year smoking history. She has never used smokeless tobacco. She reports that she does not use illicit drugs. Her alcohol history is not on file.  Allergies:  Allergies  Allergen Reactions  . Citrus Other (See Comments)    Causes interstitual cystitis flare up  . Debby FreibergEquetro [Carbamazepine Er] Swelling and Rash     (Not in a hospital admission)  No results found for this or any previous visit (from the past 48 hour(s)). No results found.  Review of Systems  Constitutional: Negative.   HENT: Negative.   Eyes: Negative.   Respiratory: Negative.   Cardiovascular: Negative.   Gastrointestinal: Negative.   Musculoskeletal: Negative.   Skin: Negative.   Neurological: Negative.   Psychiatric/Behavioral: Negative for depression, suicidal ideas, hallucinations, memory loss and substance abuse. The patient is not nervous/anxious and does not have insomnia.     Blood  pressure 135/84, pulse 98, temperature 97.8 F (36.6 C), temperature source Oral, resp. rate 16, height 5\' 5"  (1.651 m), weight 70.761 kg (156 lb), SpO2 99 %. Physical Exam  Nursing note and vitals reviewed. Constitutional: She appears well-developed and well-nourished.  HENT:  Head: Normocephalic and atraumatic.  Eyes: Conjunctivae are normal. Pupils are equal, round, and reactive to light.  Neck: Normal range of motion.  Cardiovascular: Normal rate, regular rhythm and normal heart sounds.   Respiratory: Effort normal and breath sounds normal. No respiratory distress.  GI: Soft.  Musculoskeletal: Normal range of motion.  Neurological: She is alert.  Skin: Skin is warm and dry.  Psychiatric: She has a normal mood and affect. Her behavior is normal. Judgment and thought content normal.     Assessment/Plan Patient receiving ECT today bilateral treatment for maintenance treatment of recurrent severe depression. Follow-up in one month again  Mordecai RasmussenJohn Clapacs 08/10/2015, 10:34 AM

## 2015-08-10 NOTE — Addendum Note (Signed)
Addendum  created 08/10/15 1101 by Naomie DeanWilliam K Carlito Bogert, MD   Modules edited: Orders, PRL Based Order Sets

## 2015-08-10 NOTE — Anesthesia Procedure Notes (Signed)
Date/Time: 08/10/2015 10:42 AM Performed by: Lily KocherPERALTA, Michaela Barnett Pre-anesthesia Checklist: Patient identified, Emergency Drugs available, Suction available and Patient being monitored Patient Re-evaluated:Patient Re-evaluated prior to inductionOxygen Delivery Method: Circle system utilized Preoxygenation: Pre-oxygenation with 100% oxygen Intubation Type: IV induction Ventilation: Mask ventilation without difficulty and Mask ventilation throughout procedure Airway Equipment and Method: Bite block Placement Confirmation: positive ETCO2 Dental Injury: Teeth and Oropharynx as per pre-operative assessment

## 2015-08-10 NOTE — Transfer of Care (Signed)
Immediate Anesthesia Transfer of Care Note  Patient: Michaela StallingKimberly C Melgoza  Procedure(s) Performed: ECT  Patient Location: PACU  Anesthesia Type:General  Level of Consciousness: sedated  Airway & Oxygen Therapy: Patient Spontanous Breathing and Patient connected to face mask oxygen  Post-op Assessment: Report given to RN  Post vital signs: Reviewed and stable  Last Vitals:  Filed Vitals:   08/10/15 0902 08/10/15 1053  BP: 135/84 119/82  Pulse: 98 102  Temp: 36.6 C 37.3 C  Resp: 16 19    Complications: No apparent anesthesia complications

## 2015-08-10 NOTE — Procedures (Signed)
ECT SERVICES Physician's Interval Evaluation & Treatment Note  Patient Identification: Michaela [name] MRN:  [id number] Date of Evaluation:  08/10/2015 TX #: 40  MADRS:   MMSE:   P.E. Findings:  No new physical problems identified physical exam stable  Psychiatric Interval Note:  Mood is stable no complaints of depression symptoms  Subjective:  Patient is a 39 y.o. female seen for evaluation for Electroconvulsive Therapy. No complaints  Treatment Summary:   []   Right Unilateral             [x]  Bilateral   % Energy : 1.0 ms 100%   Impedance: 1660 ohms  Seizure Energy Index: 26,103 V squared  Postictal Suppression Index: 95%  Seizure Concordance Index: 99%  Medications  Pre Shock: Xylocaine 4 mg, Toradol 30 mg, ketamine 100 mg, rocuronium 5 mg, succinylcholine 70 mg  Post Shock: None  Seizure Duration: 36 seconds by EMG, 38 seconds by EEG   Comments: Treatment tolerated well. Patient agrees to plan to follow-up in another 4 weeks on December 30   Lungs:  [x]   Clear to auscultation               []  Other:   Heart:    [x]   Regular rhythm             []  irregular rhythm    [x]   Previous H&P reviewed, patient examined and there are NO CHANGES                 []   Previous H&P reviewed, patient examined and there are changes noted.   Mordecai RasmussenJohn Clapacs, MD 12/2/201610:36 AM

## 2015-08-10 NOTE — Anesthesia Postprocedure Evaluation (Signed)
Anesthesia Post Note  Patient: Michaela [name]  Procedure(s) Performed: * No procedures listed *  Patient location during evaluation: PACU Anesthesia Type: General Level of consciousness: awake and alert Pain management: pain level controlled Vital Signs Assessment: post-procedure vital signs reviewed and stable Respiratory status: spontaneous breathing and respiratory function stable Cardiovascular status: blood pressure returned to baseline and stable Anesthetic complications: no    Last Vitals:  Filed Vitals:   08/10/15 0902 08/10/15 1053  BP: 135/84   Pulse: 98   Temp: 36.6 C 37.3 C  Resp: 16     Last Pain: There were no vitals filed for this visit.               KEPHART,WILLIAM K

## 2015-09-07 ENCOUNTER — Encounter: Payer: Self-pay | Admitting: Anesthesiology

## 2015-09-07 ENCOUNTER — Encounter
Admission: RE | Admit: 2015-09-07 | Discharge: 2015-09-07 | Disposition: A | Discharge status: Home / Self Care | Payer: BLUE CROSS/BLUE SHIELD | Source: Ambulatory Visit | Attending: Psychiatry | Admitting: Psychiatry

## 2015-09-07 NOTE — Anesthesia Preprocedure Evaluation (Deleted)
Anesthesia Evaluation  Patient identified by MRN, date of birth, ID band Patient awake    Reviewed: Allergy & Precautions, H&P , NPO status , Patient's Chart, lab work & pertinent test results, reviewed documented beta blocker date and time   Airway Mallampati: II  TM Distance: >3 FB Neck ROM: full    Dental no notable dental hx.    Pulmonary neg pulmonary ROS, former smoker,    Pulmonary exam normal breath sounds clear to auscultation       Cardiovascular Exercise Tolerance: Good negative cardio ROS   Rhythm:regular Rate:Normal     Neuro/Psych PSYCHIATRIC DISORDERS negative neurological ROS  negative psych ROS   GI/Hepatic negative GI ROS, Neg liver ROS,   Endo/Other  negative endocrine ROS  Renal/GU negative Renal ROS  negative genitourinary   Musculoskeletal negative musculoskeletal ROS (+)   Abdominal   Peds negative pediatric ROS (+)  Hematology negative hematology ROS (+)   Anesthesia Other Findings   Reproductive/Obstetrics negative OB ROS                             Anesthesia Physical Anesthesia Plan  ASA: II  Anesthesia Plan: General   Post-op Pain Management:    Induction:   Airway Management Planned:   Additional Equipment:   Intra-op Plan:   Post-operative Plan:   Informed Consent: I have reviewed the patients History and Physical, chart, labs and discussed the procedure including the risks, benefits and alternatives for the proposed anesthesia with the patient or authorized representative who has indicated his/her understanding and acceptance.   Dental Advisory Given  Plan Discussed with: CRNA  Anesthesia Plan Comments:         Anesthesia Quick Evaluation

## 2015-09-14 ENCOUNTER — Encounter
Admission: RE | Admit: 2015-09-14 | Discharge: 2015-09-14 | Disposition: A | Discharge status: Home / Self Care | Payer: BLUE CROSS/BLUE SHIELD | Source: Ambulatory Visit | Attending: Psychiatry | Admitting: Psychiatry

## 2015-09-14 NOTE — Discharge Planning (Signed)
Dr. Toni Amendlapacs delayed several hours. Pt chose to reschedule to 1/16. No ECT treatment performed. Pt states she is feeling good with no severely depressed state.

## 2015-10-03 ENCOUNTER — Encounter: Payer: BLUE CROSS/BLUE SHIELD | Admitting: Anesthesiology

## 2015-10-03 ENCOUNTER — Encounter
Admission: RE | Admit: 2015-10-03 | Discharge: 2015-10-03 | Disposition: A | Discharge status: Home / Self Care | Payer: BLUE CROSS/BLUE SHIELD | Source: Ambulatory Visit | Attending: Psychiatry | Admitting: Psychiatry

## 2015-10-03 ENCOUNTER — Encounter: Payer: Self-pay | Admitting: Anesthesiology

## 2015-10-03 DIAGNOSIS — Z91018 Allergy to other foods: Secondary | ICD-10-CM | POA: Insufficient documentation

## 2015-10-03 DIAGNOSIS — F319 Bipolar disorder, unspecified: Secondary | ICD-10-CM | POA: Insufficient documentation

## 2015-10-03 DIAGNOSIS — F332 Major depressive disorder, recurrent severe without psychotic features: Secondary | ICD-10-CM | POA: Insufficient documentation

## 2015-10-03 DIAGNOSIS — Z87891 Personal history of nicotine dependence: Secondary | ICD-10-CM | POA: Diagnosis not present

## 2015-10-03 DIAGNOSIS — F419 Anxiety disorder, unspecified: Secondary | ICD-10-CM | POA: Insufficient documentation

## 2015-10-03 DIAGNOSIS — Z888 Allergy status to other drugs, medicaments and biological substances status: Secondary | ICD-10-CM | POA: Diagnosis not present

## 2015-10-03 MED ORDER — DEXTROSE 5 % IV SOLN
250.0000 mL | Freq: Once | INTRAVENOUS | Status: AC
  Administered 2015-10-03: 250 mL via INTRAVENOUS

## 2015-10-03 MED ORDER — KETAMINE HCL 10 MG/ML IJ SOLN
INTRAMUSCULAR | Status: DC | PRN
  Administered 2015-10-03: 100 mg via INTRAVENOUS

## 2015-10-03 MED ORDER — KETOROLAC TROMETHAMINE 30 MG/ML IJ SOLN
30.0000 mg | Freq: Once | INTRAMUSCULAR | Status: AC
  Administered 2015-10-03: 30 mg via INTRAVENOUS

## 2015-10-03 MED ORDER — LIDOCAINE HCL (CARDIAC) 20 MG/ML IV SOLN: 4.0000 mg | Freq: Once | INTRAVENOUS | Status: DC

## 2015-10-03 MED ORDER — ROCURONIUM BROMIDE 50 MG/5ML IV SOLN: 5.0000 mg | Freq: Once | INTRAVENOUS | Status: DC

## 2015-10-03 MED ORDER — KETAMINE HCL 10 MG/ML IJ SOLN: 100.0000 mg | Freq: Once | INTRAMUSCULAR | Status: DC

## 2015-10-03 MED ORDER — DEXTROSE 5 % IV SOLN
INTRAVENOUS | Status: DC | PRN
  Administered 2015-10-03: 10:00:00 via INTRAVENOUS

## 2015-10-03 MED ORDER — SUCCINYLCHOLINE CHLORIDE 20 MG/ML IJ SOLN: 70.0000 mg | Freq: Once | INTRAMUSCULAR | Status: DC

## 2015-10-03 MED ORDER — SUCCINYLCHOLINE 20MG/ML (10ML) SYRINGE FOR MEDFUSION PUMP - OPTIME
INTRAMUSCULAR | Status: DC | PRN
  Administered 2015-10-03: 70 mg via INTRAVENOUS

## 2015-10-03 MED ORDER — LABETALOL HCL 5 MG/ML IV SOLN: 20.0000 mg | Freq: Once | INTRAVENOUS | Status: DC

## 2015-10-03 NOTE — Anesthesia Preprocedure Evaluation (Signed)
Anesthesia Evaluation  Patient identified by MRN, date of birth, ID band Patient awake    Reviewed: Allergy & Precautions, H&P , NPO status , Patient's Chart, lab work & pertinent test results, reviewed documented beta blocker date and time   History of Anesthesia Complications Negative for: history of anesthetic complications  Airway Mallampati: III  TM Distance: >3 FB Neck ROM: full    Dental no notable dental hx. (+) Teeth Intact   Pulmonary neg shortness of breath, neg sleep apnea, neg COPD, Recent URI , Resolved, former smoker,    Pulmonary exam normal breath sounds clear to auscultation       Cardiovascular Exercise Tolerance: Good negative cardio ROS Normal cardiovascular exam Rhythm:regular Rate:Normal     Neuro/Psych PSYCHIATRIC DISORDERS (Bipolar, anxiety, and depression) negative neurological ROS     GI/Hepatic negative GI ROS, Neg liver ROS,   Endo/Other  negative endocrine ROS  Renal/GU negative Renal ROS  negative genitourinary   Musculoskeletal   Abdominal   Peds  Hematology negative hematology ROS (+)   Anesthesia Other Findings Past Medical History:   Anxiety                                                      Depression                                                   Interstitial cystitis                           2008           Comment:"from 1 to 10 I'm a 10"   Sinus symptom                                                  Comment:watery, itchy eyes   Bipolar disorder (HCC)                                       Reproductive/Obstetrics negative OB ROS                             Anesthesia Physical Anesthesia Plan  ASA: II  Anesthesia Plan: General   Post-op Pain Management:    Induction:   Airway Management Planned:   Additional Equipment:   Intra-op Plan:   Post-operative Plan:   Informed Consent: I have reviewed the patients History and  Physical, chart, labs and discussed the procedure including the risks, benefits and alternatives for the proposed anesthesia with the patient or authorized representative who has indicated his/her understanding and acceptance.   Dental Advisory Given  Plan Discussed with: Anesthesiologist, CRNA and Surgeon  Anesthesia Plan Comments:         Anesthesia Quick Evaluation

## 2015-10-03 NOTE — H&P (Signed)
Michaela [name] is an 40 y.o. female.   Chief Complaint: Patient's mood has been worse. More depressed. Also having panic attacks in the morning. Has had a change in medication substituting Ativan for clonazepam. HPI: Patient with recurrent severe depression that has responded well to maintenance ECT but has gone a slightly longer time than usual in between treatments  Past Medical History  Diagnosis Date  . Anxiety   . Depression   . Interstitial cystitis 2008    "from 1 to 10 I'm a 10"  . Sinus symptom     watery, itchy eyes  . Bipolar disorder [facility])     Past Surgical History  Procedure Laterality Date  . Bladder stretch      for interstitial cystitis  . Wisdom tooth extraction    . Abdominal hysterectomy    . Breast enhancement surgery Bilateral     Family History  Problem Relation Age of Onset  . Hypotension Neg Hx   . Anesthesia problems Neg Hx   . Malignant hyperthermia Neg Hx   . Pseudochol deficiency Neg Hx   . Hypertension Mother   . Hypertension Father    Social History:  reports that she quit smoking about 17 years ago. Her smoking use included Cigarettes. She has a .25 pack-year smoking history. She has never used smokeless tobacco. She reports that she does not use illicit drugs. Her alcohol history is not on file.  Allergies:  Allergies  Allergen Reactions  . Citrus Other (See Comments)    Causes interstitual cystitis flare up  . Debby Freiberg Er] Swelling and Rash     (Not in a hospital admission)  No results found for this or any previous visit (from the past 48 hour(s)). No results found.  Review of Systems  Constitutional: Negative.   HENT: Negative.   Eyes: Negative.   Respiratory: Negative.   Cardiovascular: Negative.   Gastrointestinal: Negative.   Musculoskeletal: Negative.   Skin: Negative.   Neurological: Negative.   Psychiatric/Behavioral: Positive for depression. Negative for suicidal ideas, hallucinations, memory loss  and substance abuse. The patient is nervous/anxious. The patient does not have insomnia.     Blood pressure 132/90, pulse 80, temperature 96.9 F (36.1 C), resp. rate 18, height  (1.651 m), weight 67.132 kg (148 lb), SpO2 100 %. Physical Exam  Nursing note and vitals reviewed. Constitutional: She appears well-developed and well-nourished.  HENT:  Head: Normocephalic and atraumatic.  Eyes: Conjunctivae are normal. Pupils are equal, round, and reactive to light.  Neck: Normal range of motion.  Cardiovascular: Normal rate, regular rhythm and normal heart sounds.   Respiratory: Effort normal and breath sounds normal. No respiratory distress.  GI: Soft.  Musculoskeletal: Normal range of motion.  Neurological: She is alert.  Skin: Skin is warm and dry.  Psychiatric: Her speech is normal. Judgment and thought content normal. She is slowed. Cognition and memory are normal. She exhibits a depressed mood.     Assessment/Plan Reviewed plan with patient. We will have treatment today and see her back in 2 weeks. She will continue to follow-up with her usual outpatient psychiatrist.  Mordecai Rasmussen 10/03/2015, 10:26 AM

## 2015-10-03 NOTE — Procedures (Signed)
ECT SERVICES Physician's Interval Evaluation & Treatment Note  Patient Identification: Michaela [name] MRN:  [id number] Date of Evaluation:  10/03/2015 TX #: 41  MADRS:   MMSE:   P.E. Findings:  No change to physical exam vitals stable lungs clear heart  Psychiatric Interval Note:  Mood is feeling down and a little more depressed and she is having panic attacks on a more frequent basis in the morning.  Subjective:  Patient is a 40 y.o. female seen for evaluation for Electroconvulsive Therapy. Feeling a little bit worse overall  Treatment Summary:     Right Unilateral              Bilateral   % Energy : 1.0 ms 100%   Impedance: 1000 ohms  Seizure Energy Index: 34,969 V squared  Postictal Suppression Index: 99%  Seizure Concordance Index: 99%  Medications  Pre Shock: Toradol 30 mg, ketamine 100 mg, succinylcholine 70 mg  Post Shock: None  Seizure Duration: 26 seconds by EMG, 26 seconds by EEG   Comments: Patient will return in 2 weeks February 8 at her suggestion for follow-up treatment   Lungs:    Clear to auscultation                Other:   Heart:      Regular rhythm              irregular rhythm      Previous H&P reviewed, patient examined and there are NO CHANGES                   Previous H&P reviewed, patient examined and there are changes noted.   Mordecai Rasmussen, MD 1/25/201710:28 AM

## 2015-10-03 NOTE — Anesthesia Procedure Notes (Signed)
Date/Time: 10/03/2015 10:33 AM Performed by: Lily Kocher Pre-anesthesia Checklist: Patient identified, Emergency Drugs available, Suction available and Patient being monitored Patient Re-evaluated:Patient Re-evaluated prior to inductionOxygen Delivery Method: Circle system utilized Preoxygenation: Pre-oxygenation with 100% oxygen Intubation Type: IV induction Ventilation: Mask ventilation without difficulty and Mask ventilation throughout procedure Airway Equipment and Method: Bite block Placement Confirmation: positive ETCO2 Dental Injury: Teeth and Oropharynx as per pre-operative assessment

## 2015-10-03 NOTE — Transfer of Care (Signed)
Immediate Anesthesia Transfer of Care Note  Patient: Michaela [name]  Procedure(s) Performed: ECT  Patient Location: PACU  Anesthesia Type:General  Level of Consciousness: awake  Airway & Oxygen Therapy: Patient Spontanous Breathing and Patient connected to face mask oxygen  Post-op Assessment: Report given to RN  Post vital signs: Reviewed and stable  Last Vitals:  Filed Vitals:   10/03/15 0830 10/03/15 1045  BP: 132/90 123/77  Pulse:  85  Temp:  37.2 C  Resp:  15    Complications: No apparent anesthesia complications

## 2015-10-04 NOTE — Anesthesia Postprocedure Evaluation (Signed)
Anesthesia Post Note  Patient: Michaela [name]  Procedure(s) Performed: * No procedures listed *  Patient location during evaluation: PACU Anesthesia Type: General Level of consciousness: awake and alert Pain management: pain level controlled Vital Signs Assessment: post-procedure vital signs reviewed and stable Respiratory status: spontaneous breathing, nonlabored ventilation, respiratory function stable and patient connected to nasal cannula oxygen Cardiovascular status: blood pressure returned to baseline and stable Postop Assessment: no signs of nausea or vomiting Anesthetic complications: no    Last Vitals:  Filed Vitals:   10/03/15 1116 10/03/15 1123  BP:  137/77  Pulse: 82 81  Temp:    Resp:  18    Last Pain: There were no vitals filed for this visit.               Lenard Simmer

## 2015-11-07 ENCOUNTER — Telehealth: Payer: Self-pay | Admitting: *Deleted

## 2015-11-16 ENCOUNTER — Encounter
Admission: RE | Admit: 2015-11-16 | Discharge: 2015-11-16 | Disposition: A | Discharge status: Home / Self Care | Payer: BLUE CROSS/BLUE SHIELD | Source: Ambulatory Visit | Attending: Psychiatry | Admitting: Psychiatry

## 2015-11-16 ENCOUNTER — Encounter: Payer: Self-pay | Admitting: Anesthesiology

## 2015-11-16 DIAGNOSIS — F332 Major depressive disorder, recurrent severe without psychotic features: Secondary | ICD-10-CM

## 2015-11-16 DIAGNOSIS — Z87891 Personal history of nicotine dependence: Secondary | ICD-10-CM | POA: Insufficient documentation

## 2015-11-16 DIAGNOSIS — F419 Anxiety disorder, unspecified: Secondary | ICD-10-CM | POA: Insufficient documentation

## 2015-11-16 DIAGNOSIS — F319 Bipolar disorder, unspecified: Secondary | ICD-10-CM | POA: Insufficient documentation

## 2015-11-16 DIAGNOSIS — R5383 Other fatigue: Secondary | ICD-10-CM | POA: Diagnosis not present

## 2015-11-16 MED ORDER — SUGAMMADEX SODIUM 500 MG/5ML IV SOLN
INTRAVENOUS | Status: DC | PRN
  Administered 2015-11-16: 140 mg via INTRAVENOUS

## 2015-11-16 MED ORDER — SODIUM CHLORIDE 0.9 % IV SOLN
250.0000 mL | Freq: Once | INTRAVENOUS | Status: AC
  Administered 2015-11-16: 500 mL via INTRAVENOUS

## 2015-11-16 MED ORDER — KETOROLAC TROMETHAMINE 30 MG/ML IJ SOLN
30.0000 mg | Freq: Once | INTRAMUSCULAR | Status: AC
  Administered 2015-11-16: 30 mg via INTRAVENOUS

## 2015-11-16 MED ORDER — SUCCINYLCHOLINE CHLORIDE 20 MG/ML IJ SOLN
INTRAMUSCULAR | Status: DC | PRN
  Administered 2015-11-16: 70 mg via INTRAVENOUS

## 2015-11-16 MED ORDER — KETAMINE HCL 10 MG/ML IJ SOLN
INTRAMUSCULAR | Status: DC | PRN
  Administered 2015-11-16: 100 mg via INTRAVENOUS

## 2015-11-16 MED ORDER — ROCURONIUM BROMIDE 100 MG/10ML IV SOLN
INTRAVENOUS | Status: DC | PRN
  Administered 2015-11-16: 5 mg via INTRAVENOUS

## 2015-11-16 NOTE — H&P (Signed)
Michaela StallingKimberly C [name] is an 40 y.o. female.   Chief Complaint: Patient is feeling more down and depressed for the last few weeks. Lack of motivation and lack of energy. HPI: History of recurrent severe depression that responded to ECT. At our last treatment I wanted to see her back more frequently but she did not come back until now.  Past Medical History  Diagnosis Date  . Anxiety   . Depression   . Interstitial cystitis 2008    "from 1 to 10 I'm a 10"  . Sinus symptom     watery, itchy eyes  . Bipolar disorder [facility](HCC)     Past Surgical History  Procedure Laterality Date  . Bladder stretch      for interstitial cystitis  . Wisdom tooth extraction    . Abdominal hysterectomy    . Breast enhancement surgery Bilateral     Family History  Problem Relation Age of Onset  . Hypotension Neg Hx   . Anesthesia problems Neg Hx   . Malignant hyperthermia Neg Hx   . Pseudochol deficiency Neg Hx   . Hypertension Mother   . Hypertension Father    Social History:  reports that she quit smoking about 17 years ago. Her smoking use included Cigarettes. She has a .25 pack-year smoking history. She has never used smokeless tobacco. She reports that she does not use illicit drugs. Her alcohol history is not on file.  Allergies:  Allergies  Allergen Reactions  . Citrus Other (See Comments)    Causes interstitual cystitis flare up  . Debby FreibergEquetro [Carbamazepine Er] Swelling and Rash     (Not in a hospital admission)  No results found for this or any previous visit (from the past 48 hour(s)). No results found.  Review of Systems  Constitutional: Negative.   HENT: Negative.   Eyes: Negative.   Respiratory: Negative.   Cardiovascular: Negative.   Gastrointestinal: Negative.   Musculoskeletal: Negative.   Skin: Negative.   Neurological: Negative.   Psychiatric/Behavioral: Positive for depression. Negative for suicidal ideas, hallucinations, memory loss and substance abuse. The patient is not  nervous/anxious and does not have insomnia.     Blood pressure 129/71, pulse 91, temperature 98.6 F (37 C), resp. rate 18, height 5\' 7"  (1.702 m), weight 68.04 kg (150 lb), SpO2 100 %. Physical Exam  Nursing note and vitals reviewed. Constitutional: She appears well-developed and well-nourished.  HENT:  Head: Normocephalic and atraumatic.  Eyes: Conjunctivae are normal. Pupils are equal, round, and reactive to light.  Neck: Normal range of motion.  Cardiovascular: Normal rate, regular rhythm and normal heart sounds.   Respiratory: Effort normal and breath sounds normal. No respiratory distress.  GI: Soft.  Musculoskeletal: Normal range of motion.  Neurological: She is alert.  Skin: Skin is warm and dry.  Psychiatric: Judgment and thought content normal. Her speech is delayed. She is slowed. Cognition and memory are normal. She exhibits a depressed mood.     Assessment/Plan We are going to do treatment today and I have talked with her and her husband about doing more frequent treatment to try and get her through this. We'll put her on the schedule for Monday and Wednesday of next week as well.  Mordecai RasmussenJohn Clapacs, MD 11/16/2015, 11:21 AM

## 2015-11-16 NOTE — Transfer of Care (Signed)
Immediate Anesthesia Transfer of Care Note  Patient: Michaela [name]  Procedure(s) Performed: * No procedures listed *  Patient Location: PACU  Anesthesia Type:General  Level of Consciousness: sedated  Airway & Oxygen Therapy: Patient Spontanous Breathing and Patient connected to nasal cannula oxygen  Post-op Assessment: Report given to RN and Post -op Vital signs reviewed and stable  Post vital signs: Reviewed and stable  Last Vitals:  Filed Vitals:   11/16/15 0954 11/16/15 0957  BP: 129/71   Pulse: 91   Temp:  37 C  Resp: 18     Complications: No apparent anesthesia complications

## 2015-11-16 NOTE — Procedures (Signed)
ECT SERVICES Physician's Interval Evaluation & Treatment Note  Patient Identification: Michaela [name] MRN:  [id number] Date of Evaluation:  11/16/2015 TX #: 42  MADRS:   MMSE:   P.E. Findings:  No new physical findings or problems.  Psychiatric Interval Note:  Mood is more down. His lack of energy.  Subjective:  Patient is a 40 y.o. female seen for evaluation for Electroconvulsive Therapy. We agree that things of gotten worse and would like to see her back next week for a couple more treatments of boosterECT  Treatment Summary:   []   Right Unilateral             [x]  Bilateral   % Energy : 1.0 ms 100%   Impedance: 1390 ohms  Seizure Energy Index: 31,432 V squared  Postictal Suppression Index: 97%  Seizure Concordance Index: 99%  Medications  Pre Shock: Toradol 30 mg, ketamine 100 mg, rocuronium 5 mg, succinylcholine 70 mg  Post Shock:    Seizure Duration: 19 seconds by EMG 22 seconds by EEG   Comments: Powerful seizure but fairly short. We will have her back Monday and Wednesday of next week to see if we can get her out of this acute spell of depression.   Lungs:  [x]   Clear to auscultation               []  Other:   Heart:    [x]   Regular rhythm             []  irregular rhythm    [x]   Previous H&P reviewed, patient examined and there are NO CHANGES                 []   Previous H&P reviewed, patient examined and there are changes noted.   Mordecai RasmussenJohn Shontavia Mickel, MD 3/10/201711:23 AM

## 2015-11-16 NOTE — Anesthesia Preprocedure Evaluation (Signed)
Anesthesia Evaluation  Patient identified by MRN, date of birth, ID band Patient awake    Reviewed: Allergy & Precautions, H&P , NPO status , Patient's Chart, lab work & pertinent test results, reviewed documented beta blocker date and time   History of Anesthesia Complications Negative for: history of anesthetic complications  Airway Mallampati: III  TM Distance: >3 FB Neck ROM: full    Dental  (+) Poor Dentition   Pulmonary neg shortness of breath, neg sleep apnea, neg COPD, Recent URI , Resolved, former smoker,    Pulmonary exam normal breath sounds clear to auscultation       Cardiovascular Exercise Tolerance: Good negative cardio ROS Normal cardiovascular exam Rhythm:regular Rate:Normal     Neuro/Psych PSYCHIATRIC DISORDERS (Bipolar, anxiety, and depression) negative neurological ROS     GI/Hepatic negative GI ROS, Neg liver ROS,   Endo/Other  negative endocrine ROS  Renal/GU negative Renal ROS  negative genitourinary   Musculoskeletal   Abdominal   Peds  Hematology negative hematology ROS (+)   Anesthesia Other Findings Past Medical History:   Anxiety                                                      Depression                                                   Interstitial cystitis                           2008           Comment:"from 0 to 10 I'm a 10"   Sinus symptom                                                  Comment:watery, itchy eyes   Bipolar disorder (HCC)                  Past Surgical History:   bladder stretch                                                 Comment:for interstitial cystitis   WISDOM TOOTH EXTRACTION                                       ABDOMINAL HYSTERECTOMY                                        BREAST ENHANCEMENT SURGERY                      Bilateral            BMI    Body Mass Index  23.48 kg/m 2                           Reproductive/Obstetrics negative  OB ROS                             Anesthesia Physical  Anesthesia Plan  ASA: III  Anesthesia Plan: General   Post-op Pain Management:    Induction:   Airway Management Planned:   Additional Equipment:   Intra-op Plan:   Post-operative Plan:   Informed Consent: I have reviewed the patients History and Physical, chart, labs and discussed the procedure including the risks, benefits and alternatives for the proposed anesthesia with the patient or authorized representative who has indicated his/her understanding and acceptance.   Dental Advisory Given  Plan Discussed with: Anesthesiologist, CRNA and Surgeon  Anesthesia Plan Comments:         Anesthesia Quick Evaluation

## 2015-11-16 NOTE — Anesthesia Postprocedure Evaluation (Signed)
Anesthesia Post Note  Patient: Michaela [name]  Procedure(s) Performed: * No procedures listed *  Patient location during evaluation: PACU Anesthesia Type: General Level of consciousness: awake and alert Pain management: pain level controlled Vital Signs Assessment: post-procedure vital signs reviewed and stable Respiratory status: spontaneous breathing, nonlabored ventilation, respiratory function stable and patient connected to nasal cannula oxygen Cardiovascular status: blood pressure returned to baseline and stable Postop Assessment: no signs of nausea or vomiting Anesthetic complications: no    Last Vitals:  Filed Vitals:   11/16/15 1221 11/16/15 1222  BP:  121/78  Pulse: 79   Temp:    Resp: 16     Last Pain: There were no vitals filed for this visit.               Cleda MccreedyJoseph K Piscitello

## 2015-11-19 ENCOUNTER — Encounter
Admission: RE | Admit: 2015-11-19 | Discharge: 2015-11-19 | Disposition: A | Discharge status: Home / Self Care | Payer: BLUE CROSS/BLUE SHIELD | Source: Ambulatory Visit | Attending: Psychiatry | Admitting: Psychiatry

## 2015-11-19 NOTE — Procedures (Signed)
Running a fever and coughing. Likely viral syndrome. Will not proceed with procedure today and cancel Wednesday, too. Reschedule next Monday 3/20. Patient aware and agrees.

## 2015-11-19 NOTE — Plan of Care (Signed)
0940 Pt arrives for ECT and states she is not feeling good.  States she has been feeling bad all weekend with a bad cold, fever, cough and congestion.  Dr. Pernell DupreAdams notified of pts status.  [id number] Dr. Toni Amendlapacs in to see pt and spoke with pt about way she has been feeling.  Informed Dr. Toni Amendlapacs of pts status and temp of 99.8.  Dr. Toni Amendlapacs informed pt he did not feel comfortable about having her have an ECT today.  Will reschedule for next Monday.  Husband called to pick pt up to take home.  1000 Pt dischg home with husband to come back next Monday for ECt

## 2015-11-28 ENCOUNTER — Encounter: Payer: Self-pay | Admitting: Anesthesiology

## 2015-11-28 ENCOUNTER — Encounter (HOSPITAL_BASED_OUTPATIENT_CLINIC_OR_DEPARTMENT_OTHER)
Admission: RE | Admit: 2015-11-28 | Discharge: 2015-11-28 | Disposition: A | Discharge status: Home / Self Care | Payer: BLUE CROSS/BLUE SHIELD | Source: Ambulatory Visit | Attending: Psychiatry | Admitting: Psychiatry

## 2015-11-28 DIAGNOSIS — F332 Major depressive disorder, recurrent severe without psychotic features: Secondary | ICD-10-CM

## 2015-11-28 MED ORDER — SODIUM CHLORIDE 0.9 % IV SOLN
250.0000 mL | Freq: Once | INTRAVENOUS | Status: AC
  Administered 2015-11-28: 500 mL via INTRAVENOUS

## 2015-11-28 MED ORDER — ROCURONIUM BROMIDE 100 MG/10ML IV SOLN
INTRAVENOUS | Status: DC | PRN
  Administered 2015-11-28: 5 mg via INTRAVENOUS

## 2015-11-28 MED ORDER — SUCCINYLCHOLINE CHLORIDE 20 MG/ML IJ SOLN
INTRAMUSCULAR | Status: DC | PRN
  Administered 2015-11-28: 70 mg via INTRAVENOUS

## 2015-11-28 MED ORDER — SODIUM CHLORIDE 0.9 % IV SOLN
INTRAVENOUS | Status: DC | PRN
  Administered 2015-11-28: 10:00:00 via INTRAVENOUS

## 2015-11-28 MED ORDER — KETOROLAC TROMETHAMINE 30 MG/ML IJ SOLN
30.0000 mg | Freq: Once | INTRAMUSCULAR | Status: AC
  Administered 2015-11-28: 30 mg via INTRAVENOUS

## 2015-11-28 MED ORDER — KETAMINE HCL 10 MG/ML IJ SOLN
INTRAMUSCULAR | Status: DC | PRN
  Administered 2015-11-28: 100 mg via INTRAVENOUS

## 2015-11-28 NOTE — Anesthesia Postprocedure Evaluation (Signed)
Anesthesia Post Note  Patient: Michaela StallingKimberly C Geil  Procedure(s) Performed: * No procedures listed *  Patient location during evaluation: PACU Anesthesia Type: General Level of consciousness: awake and alert Pain management: pain level controlled Vital Signs Assessment: post-procedure vital signs reviewed and stable Respiratory status: spontaneous breathing and respiratory function stable Cardiovascular status: stable Anesthetic complications: no    Last Vitals:  Filed Vitals:   11/28/15 1135 11/28/15 1152  BP: 116/74 114/76  Pulse: 86 85  Temp: 36.2 C   Resp: 17 18    Last Pain:  Filed Vitals:   11/28/15 1155  PainSc: 0-No pain                 Korayma Hagwood K

## 2015-11-28 NOTE — Procedures (Signed)
ECT SERVICES Physician's Interval Evaluation & Treatment Note  Patient Identification: Michaela [name] MRN:  [id number] Date of Evaluation:  11/28/2015 TX #: 43  MADRS:   MMSE:   P.E. Findings:  No change to physical exam vitals stable.  Psychiatric Interval Note:  Mood is down and sad not suicidal tired lack of motivation  Subjective:  Patient is a 40 y.o. female seen for evaluation for Electroconvulsive Therapy. Feeling tired no motivation and no energy.  Treatment Summary:   []   Right Unilateral             [x]  Bilateral   % Energy : 1.0 ms, 100%   Impedance: 990 ohms  Seizure Energy Index: 45,038 V squared  Postictal Suppression Index: 98%  Seizure Concordance Index: 99% mg  Medications  Pre Shock: Toradol 30 mg, ketamine 100 mg, rocuronium 5 mg, succinylcholine 70 mg  Post Shock: None  Seizure Duration: 23 seconds by EEG, 23 seconds by EMG   Comments: She is not available Friday and I am out next week so we will see her back Monday, April 3.   Lungs:  [x]   Clear to auscultation               []  Other:   Heart:    [x]   Regular rhythm             []  irregular rhythm    [x]   Previous H&P reviewed, patient examined and there are NO CHANGES                 []   Previous H&P reviewed, patient examined and there are changes noted.   Mordecai RasmussenJohn Clapacs, MD 3/22/201710:59 AM

## 2015-11-28 NOTE — Transfer of Care (Signed)
Immediate Anesthesia Transfer of Care Note  Patient: Michaela [name]  Procedure(s) Performed: * No procedures listed *  Patient Location: PACU  Anesthesia Type:General  Level of Consciousness: awake and alert   Airway & Oxygen Therapy: Patient Spontanous Breathing and Patient connected to face mask oxygen  Post-op Assessment: Report given to RN and Post -op Vital signs reviewed and stable  Post vital signs: Reviewed and stable  Last Vitals:  Filed Vitals:   11/28/15 0913 11/28/15 0915  BP: 107/65   Pulse: 80   Temp:  35.9 C  Resp: 18     Complications: No apparent anesthesia complications

## 2015-11-28 NOTE — Discharge Instructions (Signed)
1)  The drugs that you have been given will stay in your system until tomorrow so for the       next 24 hours you should not:  A. Drive an automobile  B. Make any legal decisions  C. Drink any alcoholic beverages  2)  You may resume your regular meals upon return home.  3)  A responsible adult must take you home.  Someone should stay with you for a few          hours, then be available by phone for the remainder of the treatment day.  4)  You May experience any of the following symptoms:  Headache, Nausea and a dry mouth (due to the medications you were given),  temporary memory loss and some confusion, or sore muscles (a warm bath  should help this).  If you you experience any of these symptoms let us know on                your return visit.  5)  Report any of the following: any acute discomfort, severe headache, or temperature        greater than 100.5 F.   Also report any unusual redness, swelling, drainage, or pain         at your IV site.    You may report Symptoms to:  ECT PROGRAM- [place] [facility]          Phone: [phone], ECT Department           or Dr. Shary Keylapac's office [phone]  6)  Your next ECT Treatment is Day Monday  Date December 10, 2015  We will call 2 days prior to your scheduled appointment for arrival times.  7)  Nothing to eat or drink after midnight the night before your procedure.  8)  Take      With a sip of water the morning of your procedure.  9)  Other Instructions: Call [phone] to cancel the morning of your procedure due         to illness or emergency.  10) We will call within 72 hours to assess how you are feeling.

## 2015-11-28 NOTE — Anesthesia Preprocedure Evaluation (Signed)
Anesthesia Evaluation  Patient identified by MRN, date of birth, ID band Patient awake    Reviewed: Allergy & Precautions, H&P , NPO status , Patient's Chart, lab work & pertinent test results, reviewed documented beta blocker date and time   History of Anesthesia Complications Negative for: history of anesthetic complications  Airway Mallampati: III  TM Distance: >3 FB Neck ROM: full    Dental  (+) Poor Dentition   Pulmonary neg shortness of breath, neg sleep apnea, neg COPD, Recent URI , Resolved, former smoker,    Pulmonary exam normal breath sounds clear to auscultation       Cardiovascular Exercise Tolerance: Good negative cardio ROS Normal cardiovascular exam Rhythm:regular Rate:Normal     Neuro/Psych PSYCHIATRIC DISORDERS (Bipolar, anxiety, and depression) negative neurological ROS     GI/Hepatic negative GI ROS, Neg liver ROS,   Endo/Other  negative endocrine ROS  Renal/GU negative Renal ROS  negative genitourinary   Musculoskeletal   Abdominal   Peds  Hematology negative hematology ROS (+)   Anesthesia Other Findings Past Medical History:   Anxiety                                                      Depression                                                   Interstitial cystitis                           2008           Comment:"from 0 to 10 I'm a 10"   Sinus symptom                                                  Comment:watery, itchy eyes   Bipolar disorder (HCC)                  Past Surgical History:   bladder stretch                                                 Comment:for interstitial cystitis   WISDOM TOOTH EXTRACTION                                       ABDOMINAL HYSTERECTOMY                                        BREAST ENHANCEMENT SURGERY                      Bilateral            BMI    Body Mass Index  23.48 kg/m 2                           Reproductive/Obstetrics negative  OB ROS                             Anesthesia Physical  Anesthesia Plan  ASA: III  Anesthesia Plan: General   Post-op Pain Management:    Induction:   Airway Management Planned:   Additional Equipment:   Intra-op Plan:   Post-operative Plan:   Informed Consent: I have reviewed the patients History and Physical, chart, labs and discussed the procedure including the risks, benefits and alternatives for the proposed anesthesia with the patient or authorized representative who has indicated his/her understanding and acceptance.   Dental Advisory Given  Plan Discussed with: Anesthesiologist, CRNA and Surgeon  Anesthesia Plan Comments:         Anesthesia Quick Evaluation                                  Anesthesia Evaluation  Patient identified by MRN, date of birth, ID band Patient awake    Reviewed: Allergy & Precautions, H&P , NPO status , Patient's Chart, lab work & pertinent test results, reviewed documented beta blocker date and time   History of Anesthesia Complications Negative for: history of anesthetic complications  Airway Mallampati: III  TM Distance: >3 FB Neck ROM: full    Dental  (+) Poor Dentition   Pulmonary neg shortness of breath, neg sleep apnea, neg COPD, Recent URI , Resolved, former smoker,    Pulmonary exam normal breath sounds clear to auscultation       Cardiovascular Exercise Tolerance: Good negative cardio ROS Normal cardiovascular exam Rhythm:regular Rate:Normal     Neuro/Psych PSYCHIATRIC DISORDERS (Bipolar, anxiety, and depression) negative neurological ROS     GI/Hepatic negative GI ROS, Neg liver ROS,   Endo/Other  negative endocrine ROS  Renal/GU negative Renal ROS  negative genitourinary   Musculoskeletal   Abdominal   Peds  Hematology negative hematology ROS (+)   Anesthesia Other Findings Past Medical History:   Anxiety                                                       Depression                                                   Interstitial cystitis                           2008           Comment:"from 0 to 10 I'm a 10"   Sinus symptom                                                  Comment:watery, itchy eyes   Bipolar disorder (HCC)  Past Surgical History:   bladder stretch                                                 Comment:for interstitial cystitis   WISDOM TOOTH EXTRACTION                                       ABDOMINAL HYSTERECTOMY                                        BREAST ENHANCEMENT SURGERY                      Bilateral            BMI    Body Mass Index   23.48 kg/m 2                           Reproductive/Obstetrics negative OB ROS                             Anesthesia Physical  Anesthesia Plan  ASA: III  Anesthesia Plan: General   Post-op Pain Management:    Induction:   Airway Management Planned:   Additional Equipment:   Intra-op Plan:   Post-operative Plan:   Informed Consent: I have reviewed the patients History and Physical, chart, labs and discussed the procedure including the risks, benefits and alternatives for the proposed anesthesia with the patient or authorized representative who has indicated his/her understanding and acceptance.   Dental Advisory Given  Plan Discussed with: Anesthesiologist, CRNA and Surgeon  Anesthesia Plan Comments:         Anesthesia Quick Evaluation

## 2015-11-28 NOTE — H&P (Signed)
Michaela [name] is an 40 y.o. female.   Chief Complaint: Mood has become more depressed and down with a lot of fatigue and tiredness and lack of motivation HPI: History of recurrent severe depression had been responsive to ECT recently has gone downhill.  Past Medical History  Diagnosis Date  . Anxiety   . Depression   . Interstitial cystitis 2008    "from 0 to 10 I'm a 10"  . Sinus symptom     watery, itchy eyes  . Bipolar disorder [facility](HCC)     Past Surgical History  Procedure Laterality Date  . Bladder stretch      for interstitial cystitis  . Wisdom tooth extraction    . Abdominal hysterectomy    . Breast enhancement surgery Bilateral     Family History  Problem Relation Age of Onset  . Hypotension Neg Hx   . Anesthesia problems Neg Hx   . Malignant hyperthermia Neg Hx   . Pseudochol deficiency Neg Hx   . Hypertension Mother   . Hypertension Father    Social History:  reports that she quit smoking about 17 years ago. Her smoking use included Cigarettes. She has a .25 pack-year smoking history. She has never used smokeless tobacco. She reports that she does not use illicit drugs. Her alcohol history is not on file.  Allergies:  Allergies  Allergen Reactions  . Citrus Other (See Comments)    Causes interstitual cystitis flare up  . Debby FreibergEquetro [Carbamazepine Er] Swelling and Rash     (Not in a hospital admission)  No results found for this or any previous visit (from the past 48 hour(s)). No results found.  Review of Systems  Constitutional: Negative.   HENT: Negative.   Eyes: Negative.   Respiratory: Negative.   Cardiovascular: Negative.   Gastrointestinal: Negative.   Musculoskeletal: Negative.   Skin: Negative.   Neurological: Negative.   Psychiatric/Behavioral: Positive for depression. Negative for suicidal ideas, hallucinations, memory loss and substance abuse. The patient is nervous/anxious. The patient does not have insomnia.     Blood pressure 107/65,  pulse 80, temperature 96.6 F (35.9 C), resp. rate 18, height 5\' 7"  (1.702 m), weight 66.679 kg (147 lb), SpO2 100 %. Physical Exam  Nursing note and vitals reviewed. Constitutional: She appears well-developed and well-nourished.  HENT:  Head: Normocephalic and atraumatic.  Eyes: Conjunctivae are normal. Pupils are equal, round, and reactive to light.  Neck: Normal range of motion.  Cardiovascular: Normal rate, regular rhythm and normal heart sounds.   Respiratory: Effort normal.  GI: Soft.  Musculoskeletal: Normal range of motion.  Neurological: She is alert.  Skin: Skin is warm and dry.  Psychiatric: Judgment and thought content normal. Her speech is delayed. She is slowed. Cognition and memory are normal. She exhibits a depressed mood.     Assessment/Plan Patient had missed some treatments because of her illness and scheduling difficulties. I will be away next week and she is not available Friday. I will see her back April 3  Mordecai RasmussenJohn Clapacs, MD 11/28/2015, 10:57 AM

## 2015-12-10 ENCOUNTER — Encounter: Payer: Self-pay | Admitting: Anesthesiology

## 2015-12-10 ENCOUNTER — Encounter
Admission: RE | Admit: 2015-12-10 | Discharge: 2015-12-10 | Disposition: A | Discharge status: Home / Self Care | Payer: BLUE CROSS/BLUE SHIELD | Source: Ambulatory Visit | Attending: Psychiatry | Admitting: Psychiatry

## 2015-12-10 DIAGNOSIS — Z87891 Personal history of nicotine dependence: Secondary | ICD-10-CM | POA: Insufficient documentation

## 2015-12-10 DIAGNOSIS — F319 Bipolar disorder, unspecified: Secondary | ICD-10-CM | POA: Insufficient documentation

## 2015-12-10 DIAGNOSIS — Z90710 Acquired absence of both cervix and uterus: Secondary | ICD-10-CM | POA: Insufficient documentation

## 2015-12-10 DIAGNOSIS — Z8249 Family history of ischemic heart disease and other diseases of the circulatory system: Secondary | ICD-10-CM | POA: Insufficient documentation

## 2015-12-10 DIAGNOSIS — Z888 Allergy status to other drugs, medicaments and biological substances status: Secondary | ICD-10-CM | POA: Insufficient documentation

## 2015-12-10 DIAGNOSIS — F419 Anxiety disorder, unspecified: Secondary | ICD-10-CM | POA: Diagnosis not present

## 2015-12-10 DIAGNOSIS — Z8489 Family history of other specified conditions: Secondary | ICD-10-CM | POA: Diagnosis not present

## 2015-12-10 DIAGNOSIS — F332 Major depressive disorder, recurrent severe without psychotic features: Secondary | ICD-10-CM | POA: Insufficient documentation

## 2015-12-10 DIAGNOSIS — Z91018 Allergy to other foods: Secondary | ICD-10-CM | POA: Diagnosis not present

## 2015-12-10 DIAGNOSIS — Z9889 Other specified postprocedural states: Secondary | ICD-10-CM | POA: Diagnosis not present

## 2015-12-10 MED ORDER — SODIUM CHLORIDE 0.9 % IV SOLN
INTRAVENOUS | Status: DC | PRN
  Administered 2015-12-10: 11:00:00 via INTRAVENOUS

## 2015-12-10 MED ORDER — SUCCINYLCHOLINE 20MG/ML (10ML) SYRINGE FOR MEDFUSION PUMP - OPTIME
INTRAMUSCULAR | Status: DC | PRN
  Administered 2015-12-10: 70 mg via INTRAVENOUS

## 2015-12-10 MED ORDER — KETOROLAC TROMETHAMINE 30 MG/ML IJ SOLN
30.0000 mg | Freq: Once | INTRAMUSCULAR | Status: AC
  Administered 2015-12-10: 30 mg via INTRAVENOUS

## 2015-12-10 MED ORDER — SODIUM CHLORIDE 0.9 % IV SOLN
250.0000 mL | Freq: Once | INTRAVENOUS | Status: AC
  Administered 2015-12-10: 10:00:00 via INTRAVENOUS

## 2015-12-10 MED ORDER — KETAMINE HCL 10 MG/ML IJ SOLN
INTRAMUSCULAR | Status: DC | PRN
  Administered 2015-12-10: 100 mg via INTRAVENOUS

## 2015-12-10 NOTE — Anesthesia Procedure Notes (Signed)
Date/Time: 12/10/2015 10:40 AM Performed by: Lily KocherPERALTA, Michaela Irani Pre-anesthesia Checklist: Patient identified, Emergency Drugs available, Suction available and Patient being monitored Patient Re-evaluated:Patient Re-evaluated prior to inductionOxygen Delivery Method: Circle system utilized Preoxygenation: Pre-oxygenation with 100% oxygen Intubation Type: IV induction Ventilation: Mask ventilation without difficulty and Mask ventilation throughout procedure Airway Equipment and Method: Bite block Placement Confirmation: positive ETCO2 Dental Injury: Teeth and Oropharynx as per pre-operative assessment

## 2015-12-10 NOTE — Procedures (Signed)
ECT SERVICES Physician's Interval Evaluation & Treatment Note  Patient Identification: Michaela [name] MRN:  [id number] Date of Evaluation:  12/10/2015 TX #: 44  MADRS: 14  MMSE: 30  P.E. Findings:  Heart and lungs normal. Vital signs normal. No change to exam.  Psychiatric Interval Note:  Mood still has some ups and downs but is not feeling severely depressed suicidality.  Subjective:  Patient is a 40 y.o. female seen for evaluation for Electroconvulsive Therapy. No specific new complaint. Has resolved the respiratory infection and a problem month or so ago  Treatment Summary:   []   Right Unilateral             [x]  Bilateral   % Energy : 1.0 ms, 100%   Impedance: 1450 ohms  Seizure Energy Index: 29,951 V squared  Postictal Suppression Index: 96%  Seizure Concordance Index: 99%  Medications  Pre Shock: Toradol 30 mg, ketamine 110 mg, succinylcholine 70 mg  Post Shock:    Seizure Duration: 23 seconds by EMG, 29 seconds by EEG   Comments: Follow-up anticipated 2 weeks April 14   Lungs:  [x]   Clear to auscultation               []  Other:   Heart:    [x]   Regular rhythm             []  irregular rhythm    [x]   Previous H&P reviewed, patient examined and there are NO CHANGES                 []   Previous H&P reviewed, patient examined and there are changes noted.   Mordecai RasmussenJohn Clapacs, MD 4/3/201710:31 AM

## 2015-12-10 NOTE — H&P (Signed)
Michaela [name] is an 40 y.o. female.   Chief Complaint: Patient reports that her mood is feeling better. She still has ups and downs in her mood but does not feel like she is staying in sustained periods of depression. HPI: Recurrent severe depression responsive to ECT which has done better with bilateral treatment. Recently had a downturn in symptoms which seems to be resolving  Past Medical History  Diagnosis Date  . Anxiety   . Depression   . Interstitial cystitis 2008    "from 0 to 10 I'm a 10"  . Sinus symptom     watery, itchy eyes  . Bipolar disorder [facility](HCC)     Past Surgical History  Procedure Laterality Date  . Bladder stretch      for interstitial cystitis  . Wisdom tooth extraction    . Abdominal hysterectomy    . Breast enhancement surgery Bilateral     Family History  Problem Relation Age of Onset  . Hypotension Neg Hx   . Anesthesia problems Neg Hx   . Malignant hyperthermia Neg Hx   . Pseudochol deficiency Neg Hx   . Hypertension Mother   . Hypertension Father    Social History:  reports that she quit smoking about 17 years ago. Her smoking use included Cigarettes. She has a .25 pack-year smoking history. She has never used smokeless tobacco. She reports that she does not use illicit drugs. Her alcohol history is not on file.  Allergies:  Allergies  Allergen Reactions  . Citrus Other (See Comments)    Causes interstitual cystitis flare up  . Debby FreibergEquetro [Carbamazepine Er] Swelling and Rash     (Not in a hospital admission)  No results found for this or any previous visit (from the past 48 hour(s)). No results found.  Review of Systems  Constitutional: Negative.   HENT: Negative.   Eyes: Negative.   Respiratory: Negative.   Cardiovascular: Negative.   Gastrointestinal: Negative.   Musculoskeletal: Negative.   Skin: Negative.   Neurological: Negative.   Psychiatric/Behavioral: Negative for depression, suicidal ideas, hallucinations, memory loss and  substance abuse. The patient is not nervous/anxious and does not have insomnia.     Blood pressure 126/82, pulse 88, temperature 98.3 F (36.8 C), temperature source Tympanic, resp. rate 16, height 5\' 7"  (1.702 m), weight 68.493 kg (151 lb), SpO2 99 %. Physical Exam  Nursing note and vitals reviewed. Constitutional: She appears well-developed and well-nourished.  HENT:  Head: Normocephalic and atraumatic.  Eyes: Conjunctivae are normal. Pupils are equal, round, and reactive to light.  Neck: Normal range of motion.  Cardiovascular: Normal rate, regular rhythm and normal heart sounds.   Respiratory: Effort normal and breath sounds normal. No respiratory distress.  GI: Soft.  Musculoskeletal: Normal range of motion.  Neurological: She is alert.  Skin: Skin is warm and dry.  Psychiatric: She has a normal mood and affect. Her behavior is normal. Judgment and thought content normal.     Assessment/Plan Follow-up 2 weeks April 17  Mordecai RasmussenJohn Earle Troiano, MD 12/10/2015, 10:29 AM

## 2015-12-10 NOTE — Discharge Instructions (Signed)
1)  The drugs that you have been given will stay in your system until tomorrow so for the       next 24 hours you should not:  A. Drive an automobile  B. Make any legal decisions  C. Drink any alcoholic beverages  2)  You may resume your regular meals upon return home.  3)  A responsible adult must take you home.  Someone should stay with you for a few          hours, then be available by phone for the remainder of the treatment day.  4)  You May experience any of the following symptoms:  Headache, Nausea and a dry mouth (due to the medications you were given),  temporary memory loss and some confusion, or sore muscles (a warm bath  should help this).  If you you experience any of these symptoms let us know on                your return visit.  5)  Report any of the following: any acute discomfort, severe headache, or temperature        greater than 100.5 F.   Also report any unusual redness, swelling, drainage, or pain         at your IV site.    You may report Symptoms to:  ECT PROGRAM- [place] [facility]          Phone: [phone], ECT Department           or Dr. Shary Keylapac's office [phone]  6)  Your next ECT Treatment is Friday December 24, 2015  We will call 2 days prior to your scheduled appointment for arrival times.  7)  Nothing to eat or drink after midnight the night before your procedure.  8)  Take    With a sip of water the morning of your procedure.  9)  Other Instructions: Call [phone] to cancel the morning of your procedure due         to illness or emergency.  10) We will call within 72 hours to assess how you are feeling.

## 2015-12-10 NOTE — Transfer of Care (Signed)
Immediate Anesthesia Transfer of Care Note  Patient: Michaela [name]  Procedure(s) Performed: ECT  Patient Location: PACU  Anesthesia Type:General  Level of Consciousness: awake and patient cooperative  Airway & Oxygen Therapy: Patient Spontanous Breathing and Patient connected to face mask oxygen  Post-op Assessment: Report given to RN  Post vital signs: Reviewed and stable  Last Vitals:  Filed Vitals:   12/10/15 1050 12/10/15 1051  BP: 116/93 116/93  Pulse: 85 85  Temp: 37.7 C 99.59F  Resp: 22 22    Complications: No apparent anesthesia complications

## 2015-12-10 NOTE — Anesthesia Postprocedure Evaluation (Signed)
Anesthesia Post Note  Patient: Michaela [name]  Procedure(s) Performed: * No procedures listed *  Patient location during evaluation: PACU Anesthesia Type: General Level of consciousness: awake and alert Pain management: pain level controlled Vital Signs Assessment: post-procedure vital signs reviewed and stable Respiratory status: spontaneous breathing, nonlabored ventilation, respiratory function stable and patient connected to nasal cannula oxygen Cardiovascular status: blood pressure returned to baseline and stable Postop Assessment: no signs of nausea or vomiting Anesthetic complications: no    Last Vitals:  Filed Vitals:   12/10/15 1124 12/10/15 1144  BP: 119/73   Pulse: 80   Temp:  36.9 C  Resp: 18     Last Pain:  Filed Vitals:   12/10/15 1144  PainSc: 0-No pain                 Lenard SimmerAndrew Peniel Hass

## 2015-12-10 NOTE — Anesthesia Preprocedure Evaluation (Signed)
Anesthesia Evaluation  Patient identified by MRN, date of birth, ID band Patient awake    Reviewed: Allergy & Precautions, H&P , NPO status , Patient's Chart, lab work & pertinent test results, reviewed documented beta blocker date and time   History of Anesthesia Complications Negative for: history of anesthetic complications  Airway Mallampati: III  TM Distance: >3 FB Neck ROM: full    Dental  (+) Poor Dentition   Pulmonary neg shortness of breath, neg sleep apnea, neg COPD, Recent URI , Resolved, former smoker,    Pulmonary exam normal breath sounds clear to auscultation       Cardiovascular Exercise Tolerance: Good negative cardio ROS Normal cardiovascular exam Rhythm:regular Rate:Normal     Neuro/Psych PSYCHIATRIC DISORDERS (Bipolar, anxiety, and depression) negative neurological ROS     GI/Hepatic negative GI ROS, Neg liver ROS,   Endo/Other  negative endocrine ROS  Renal/GU negative Renal ROS  negative genitourinary   Musculoskeletal   Abdominal   Peds  Hematology negative hematology ROS (+)   Anesthesia Other Findings Past Medical History:   Anxiety                                                      Depression                                                   Interstitial cystitis                           2008           Comment:"from 0 to 10 I'm a 10"   Sinus symptom                                                  Comment:watery, itchy eyes   Bipolar disorder (HCC)                  Past Surgical History:   bladder stretch                                                 Comment:for interstitial cystitis   WISDOM TOOTH EXTRACTION                                       ABDOMINAL HYSTERECTOMY                                        BREAST ENHANCEMENT SURGERY                      Bilateral            BMI    Body Mass Index  23.48 kg/m 2                           Reproductive/Obstetrics negative  OB ROS                             Anesthesia Physical  Anesthesia Plan  ASA: III  Anesthesia Plan: General   Post-op Pain Management:    Induction:   Airway Management Planned:   Additional Equipment:   Intra-op Plan:   Post-operative Plan:   Informed Consent: I have reviewed the patients History and Physical, chart, labs and discussed the procedure including the risks, benefits and alternatives for the proposed anesthesia with the patient or authorized representative who has indicated his/her understanding and acceptance.   Dental Advisory Given  Plan Discussed with: Anesthesiologist, CRNA and Surgeon  Anesthesia Plan Comments:         Anesthesia Quick Evaluation                                  Anesthesia Evaluation  Patient identified by MRN, date of birth, ID band Patient awake    Reviewed: Allergy & Precautions, H&P , NPO status , Patient's Chart, lab work & pertinent test results, reviewed documented beta blocker date and time   History of Anesthesia Complications Negative for: history of anesthetic complications  Airway Mallampati: III  TM Distance: >3 FB Neck ROM: full    Dental  (+) Poor Dentition   Pulmonary neg shortness of breath, neg sleep apnea, neg COPD, Recent URI , Resolved, former smoker,    Pulmonary exam normal breath sounds clear to auscultation       Cardiovascular Exercise Tolerance: Good negative cardio ROS Normal cardiovascular exam Rhythm:regular Rate:Normal     Neuro/Psych PSYCHIATRIC DISORDERS (Bipolar, anxiety, and depression) negative neurological ROS     GI/Hepatic negative GI ROS, Neg liver ROS,   Endo/Other  negative endocrine ROS  Renal/GU negative Renal ROS  negative genitourinary   Musculoskeletal   Abdominal   Peds  Hematology negative hematology ROS (+)   Anesthesia Other Findings Past Medical History:   Anxiety                                                       Depression                                                   Interstitial cystitis                           2008           Comment:"from 0 to 10 I'm a 10"   Sinus symptom                                                  Comment:watery, itchy eyes   Bipolar disorder (HCC)  Past Surgical History:   bladder stretch                                                 Comment:for interstitial cystitis   WISDOM TOOTH EXTRACTION                                       ABDOMINAL HYSTERECTOMY                                        BREAST ENHANCEMENT SURGERY                      Bilateral            BMI    Body Mass Index   23.48 kg/m 2                           Reproductive/Obstetrics negative OB ROS                             Anesthesia Physical  Anesthesia Plan  ASA: III  Anesthesia Plan: General   Post-op Pain Management:    Induction:   Airway Management Planned:   Additional Equipment:   Intra-op Plan:   Post-operative Plan:   Informed Consent: I have reviewed the patients History and Physical, chart, labs and discussed the procedure including the risks, benefits and alternatives for the proposed anesthesia with the patient or authorized representative who has indicated his/her understanding and acceptance.   Dental Advisory Given  Plan Discussed with: Anesthesiologist, CRNA and Surgeon  Anesthesia Plan Comments:         Anesthesia Quick Evaluation

## 2022-10-07 ENCOUNTER — Other Ambulatory Visit: Payer: Self-pay

## 2022-10-07 ENCOUNTER — Encounter (HOSPITAL_COMMUNITY): Payer: Self-pay

## 2022-10-07 ENCOUNTER — Emergency Department (HOSPITAL_COMMUNITY)
Admission: EM | Admit: 2022-10-07 | Discharge: 2022-10-07 | Disposition: A | Discharge status: Home / Self Care | Payer: Self-pay | Attending: Emergency Medicine | Admitting: Emergency Medicine

## 2022-10-07 DIAGNOSIS — N644 Mastodynia: Secondary | ICD-10-CM | POA: Insufficient documentation

## 2022-10-07 DIAGNOSIS — R21 Rash and other nonspecific skin eruption: Secondary | ICD-10-CM | POA: Insufficient documentation

## 2022-10-07 LAB — CBC WITH DIFFERENTIAL/PLATELET
Abs Immature Granulocytes: 0.01 10*3/uL (ref 0.00–0.07)
Basophils Absolute: 0 10*3/uL (ref 0.0–0.1)
Basophils Relative: 0 %
Eosinophils Absolute: 0 10*3/uL (ref 0.0–0.5)
Eosinophils Relative: 0 %
HCT: 44.1 % (ref 36.0–46.0)
Hemoglobin: 14.4 g/dL (ref 12.0–15.0)
Immature Granulocytes: 0 %
Lymphocytes Relative: 31 %
Lymphs Abs: 1.8 10*3/uL (ref 0.7–4.0)
MCH: 31.4 pg (ref 26.0–34.0)
MCHC: 32.7 g/dL (ref 30.0–36.0)
MCV: 96.3 fL (ref 80.0–100.0)
Monocytes Absolute: 0.4 10*3/uL (ref 0.1–1.0)
Monocytes Relative: 7 %
Neutro Abs: 3.6 10*3/uL (ref 1.7–7.7)
Neutrophils Relative %: 62 %
Platelets: 310 10*3/uL (ref 150–400)
RBC: 4.58 MIL/uL (ref 3.87–5.11)
RDW: 13.7 % (ref 11.5–15.5)
WBC: 5.8 10*3/uL (ref 4.0–10.5)
nRBC: 0 % (ref 0.0–0.2)

## 2022-10-07 LAB — BASIC METABOLIC PANEL
Anion gap: 8 (ref 5–15)
BUN: 15 mg/dL (ref 6–20)
CO2: 27 mmol/L (ref 22–32)
Calcium: 9.6 mg/dL (ref 8.9–10.3)
Chloride: 105 mmol/L (ref 98–111)
Creatinine, Ser: 1.1 mg/dL — ABNORMAL HIGH (ref 0.44–1.00)
GFR, Estimated: >60 mL/min (ref >60)
Glucose, Bld: 120 mg/dL — ABNORMAL HIGH (ref 70–99)
Potassium: 3.4 mmol/L — ABNORMAL LOW (ref 3.5–5.1)
Sodium: 140 mmol/L (ref 135–145)

## 2022-10-07 MED ORDER — PREDNISONE 20 MG PO TABS: 40.0000 mg | ORAL_TABLET | Freq: Every day | ORAL | 0 refills | Status: DC

## 2022-10-07 NOTE — ED Triage Notes (Addendum)
Patient has had whole body itching, weight loss, right breast is painful to touch, swollen lymph nodes for 1 week, no energy. No nipple discharge. History of breast implants.

## 2022-10-07 NOTE — Discharge Instructions (Addendum)
You would likely need a mammogram to evaluate your breast.  Your PCP can help arrange this or potentially can go to the breast center.  Also the steroids may help with the rash.  Follow-up with your doctor and dermatology.

## 2022-10-07 NOTE — ED Provider Notes (Signed)
[place] [facility] Provider Note   CSN: [id number] Arrival date & time: 10/07/22  1823     History  Chief Complaint  Patient presents with   Breast Pain    Michaela [name] is a 47 y.o. female.  HPI Patient presents with rash swollen lymph nodes and feeling swelling over her right breast.  Has had these complaints for a while.  States worse recently.  States she had been getting UV therapy to help with aging and it burned her skin.  States she kept getting treatment despite this.  Has had itchy rash diffusely.  Had seen PCP months ago and was supposed to have dermatology follow-up but did not go.  Also has had swelling above her right breast.  Reportedly had swelling in the armpit.  Had also seen PCP and had ordered mammogram which had not been done.  No fevers.  No cough.  Patient is somewhat anxious.  No history of IV drug use.  No cancer history.    Home Medications Prior to Admission medications   Medication Sig Start Date End Date Taking? Authorizing Provider  predniSONE (DELTASONE) 20 MG tablet Take 2 tablets (40 mg total) by mouth daily. 10/07/22  Yes Davonna Belling, MD  Cariprazine HCl 3 MG CAPS Take by mouth 1 day or 1 dose. Reported on 11/16/2015    [provider]  clonazePAM (KLONOPIN) 1 MG tablet Take 1 mg by mouth 2 (two) times daily as needed for anxiety. Reported on 11/16/2015    [provider]  gabapentin (NEURONTIN) 100 MG capsule Take 100 mg by mouth at bedtime. Reported on 11/16/2015    [provider]  gabapentin (NEURONTIN) 300 MG capsule Take 300 mg by mouth at bedtime. Reported on 11/16/2015    [provider]  HYDROcodone-acetaminophen (NORCO/VICODIN) 5-325 MG per tablet Take 1 tablet by mouth every 6 (six) hours as needed for moderate pain. Reported on 11/16/2015    [provider]  hydrOXYzine (ATARAX/VISTARIL) 25 MG tablet Take 25 mg by mouth 3 (three) times daily. Reported on  11/16/2015    [provider]  lamoTRIgine (LAMICTAL) 200 MG tablet Take 250 mg by mouth daily. Pt is taking 250mg  per day ( 2 tabs 125mg  each)    [provider]  lidocaine (XYLOCAINE) 2 % jelly 1 application daily.    [provider]  LORazepam (ATIVAN) 1 MG tablet Take 1 mg by mouth 2 (two) times daily.    [provider]  QUEtiapine (SEROQUEL) 400 MG tablet Take 200 mg by mouth at bedtime. Reported on 11/16/2015    [provider]  ziprasidone (GEODON) 40 MG capsule Take 40 mg by mouth daily. Reported on 11/16/2015    [provider]      Allergies    Citrus and Margarite Gouge er]    Review of Systems   Review of Systems  Physical Exam Updated Vital Signs BP 103/82 (BP Location: Right Arm)   Pulse 100   Temp 98.3 F (36.8 C) (Oral)   Resp 16   SpO2 99%  Physical Exam Vitals and nursing note reviewed.  Cardiovascular:     Rate and Rhythm: Normal rate and regular rhythm.  Pulmonary:     Comments: Potential fullness above breast implant on right side.  No skin changes.  No nipple retraction.  No axillary swelling or lymph nodes palpated. Abdominal:     Tenderness: There is no abdominal tenderness.  Musculoskeletal:  Cervical back: Neck supple.  Skin:    Comments: Round areas of excoriation somewhat diffusely over extremities.  Also on chest and back.  Not on her face.  Not on her palms.  No fluctuance.  No petechiae.  Neurological:     Mental Status: She is alert.     ED Results / Procedures / Treatments   Labs (all labs ordered are listed, but only abnormal results are displayed) Labs Reviewed  BASIC METABOLIC PANEL - Abnormal; Notable for the following components:      Result Value   Potassium 3.4 (*)    Glucose, Bld 120 (*)    Creatinine, Ser 1.10 (*)    All other components within normal limits  CBC WITH DIFFERENTIAL/PLATELET    EKG None  Radiology No results found.  Procedures Procedures     Medications Ordered in ED Medications - No data to display  ED Course/ Medical Decision Making/ A&P                             Medical Decision Making Risk Prescription drug management.   Patient with several of complaints.  Rash.  Has been going for months.  Itchy.  Has been on various creams without relief.  Will try short course of steroids.  Follow-up with dermatology as previously planned.  Also with fullness above right breast implant.  Has also been there for a while.  Patient is nervous that it could be a cancer.  Had had previous ordered MRI had not been done.  Can follow-up with PCP or potentially with breast center to have further evaluation done.  However did not think he needs to be done tonight.  No swelling in the axilla.  No cellulitis on the breast.  Appears stable for discharge home.        Final Clinical Impression(s) / ED Diagnoses Final diagnoses:  Breast pain  Rash    Rx / DC Orders ED Discharge Orders          Ordered    predniSONE (DELTASONE) 20 MG tablet  Daily        10/07/22 2152              Davonna Belling, MD 10/07/22 2311

## 2022-10-07 NOTE — ED Provider Triage Note (Signed)
Emergency Medicine Provider Triage Evaluation Note  DISA RIEDLINGER , a 47 y.o. female  was evaluated in triage.  Pt complains of swollen lymph nodes and R breast pain for the past week.  She states she has felt a "mass" in her breast and had an inverted nipple.  Denies nipple discharge, fever.  Hx of breast implants.   Review of Systems  Positive: As above Negative: As above  Physical Exam  BP 103/82 (BP Location: Right Arm)   Pulse 100   Temp 98.3 F (36.8 C) (Oral)   Resp 16   SpO2 99%  Gen:   Awake, no distress   Resp:  Normal effort  MSK:   Moves extremities without difficulty  Other:  Multiple scabbed over lesions to upper extremities; R breast is TTP in the RUQ  Medical Decision Making  Medically screening exam initiated at 6:44 PM.  Appropriate orders placed.  MISCHELLE REEG was informed that the remainder of the evaluation will be completed by another provider, this initial triage assessment does not replace that evaluation, and the importance of remaining in the ED until their evaluation is complete.     Theressa Stamps R, [place] 10/07/22 [phone]

## 2023-01-05 DIAGNOSIS — F319 Bipolar disorder, unspecified: Secondary | ICD-10-CM | POA: Insufficient documentation

## 2023-01-05 DIAGNOSIS — F22 Delusional disorders: Secondary | ICD-10-CM | POA: Insufficient documentation

## 2023-01-05 DIAGNOSIS — F419 Anxiety disorder, unspecified: Secondary | ICD-10-CM | POA: Insufficient documentation

## 2023-01-05 DIAGNOSIS — Z1152 Encounter for screening for COVID-19: Secondary | ICD-10-CM | POA: Insufficient documentation

## 2023-01-05 DIAGNOSIS — Z79899 Other long term (current) drug therapy: Secondary | ICD-10-CM | POA: Insufficient documentation

## 2023-01-06 ENCOUNTER — Ambulatory Visit (HOSPITAL_COMMUNITY)
Admission: EM | Admit: 2023-01-06 | Discharge: 2023-01-06 | Disposition: A | Discharge status: Home / Self Care | Payer: Medicare Other | Attending: Psychiatry | Admitting: Psychiatry

## 2023-01-06 ENCOUNTER — Encounter (HOSPITAL_COMMUNITY): Payer: Self-pay | Admitting: Registered Nurse

## 2023-01-06 ENCOUNTER — Telehealth (HOSPITAL_COMMUNITY): Payer: Self-pay | Admitting: Psychiatry

## 2023-01-06 DIAGNOSIS — F4310 Post-traumatic stress disorder, unspecified: Secondary | ICD-10-CM

## 2023-01-06 DIAGNOSIS — F419 Anxiety disorder, unspecified: Secondary | ICD-10-CM

## 2023-01-06 DIAGNOSIS — F411 Generalized anxiety disorder: Secondary | ICD-10-CM | POA: Diagnosis present

## 2023-01-06 DIAGNOSIS — F3161 Bipolar disorder, current episode mixed, mild: Secondary | ICD-10-CM | POA: Diagnosis not present

## 2023-01-06 DIAGNOSIS — F319 Bipolar disorder, unspecified: Secondary | ICD-10-CM | POA: Diagnosis present

## 2023-01-06 DIAGNOSIS — Z79899 Other long term (current) drug therapy: Secondary | ICD-10-CM | POA: Diagnosis not present

## 2023-01-06 DIAGNOSIS — F22 Delusional disorders: Secondary | ICD-10-CM | POA: Diagnosis not present

## 2023-01-06 DIAGNOSIS — F29 Unspecified psychosis not due to a substance or known physiological condition: Secondary | ICD-10-CM | POA: Diagnosis present

## 2023-01-06 DIAGNOSIS — Z1152 Encounter for screening for COVID-19: Secondary | ICD-10-CM | POA: Diagnosis not present

## 2023-01-06 DIAGNOSIS — F909 Attention-deficit hyperactivity disorder, unspecified type: Secondary | ICD-10-CM

## 2023-01-06 LAB — SARS CORONAVIRUS 2 BY RT PCR: SARS Coronavirus 2 by RT PCR: NEGATIVE

## 2023-01-06 LAB — POCT URINE DRUG SCREEN - MANUAL ENTRY (I-SCREEN)
POC Amphetamine UR: POSITIVE — AB
POC Buprenorphine (BUP): NOT DETECTED
POC Cocaine UR: NOT DETECTED
POC Marijuana UR: NOT DETECTED
POC Methadone UR: NOT DETECTED
POC Methamphetamine UR: NOT DETECTED
POC Morphine: NOT DETECTED
POC Oxazepam (BZO): POSITIVE — AB
POC Oxycodone UR: NOT DETECTED
POC Secobarbital (BAR): NOT DETECTED

## 2023-01-06 MED ORDER — OLANZAPINE 5 MG PO TBDP: 5.0000 mg | ORAL_TABLET | Freq: Three times a day (TID) | ORAL | Status: DC | PRN

## 2023-01-06 MED ORDER — LORAZEPAM 1 MG PO TABS: 1.0000 mg | ORAL_TABLET | Freq: Four times a day (QID) | ORAL | 0 refills | Status: AC | PRN

## 2023-01-06 MED ORDER — GABAPENTIN 300 MG PO CAPS: 300.0000 mg | ORAL_CAPSULE | Freq: Every day | ORAL | Status: DC

## 2023-01-06 MED ORDER — TRAZODONE HCL 100 MG PO TABS: 100.0000 mg | ORAL_TABLET | Freq: Every evening | ORAL | Status: DC | PRN

## 2023-01-06 MED ORDER — MAGNESIUM HYDROXIDE 400 MG/5ML PO SUSP: 30.0000 mL | Freq: Every day | ORAL | Status: DC | PRN

## 2023-01-06 MED ORDER — ACETAMINOPHEN 325 MG PO TABS: 650.0000 mg | ORAL_TABLET | Freq: Four times a day (QID) | ORAL | Status: DC | PRN

## 2023-01-06 MED ORDER — TRETINOIN 0.05 % EX CREA: 1.0000 | TOPICAL_CREAM | Freq: Every day | CUTANEOUS | Status: DC

## 2023-01-06 MED ORDER — GABAPENTIN 600 MG PO TABS: 300.0000 mg | ORAL_TABLET | Freq: Four times a day (QID) | ORAL | 0 refills | Status: AC

## 2023-01-06 MED ORDER — LORAZEPAM 1 MG PO TABS: 1.0000 mg | ORAL_TABLET | ORAL | Status: DC | PRN

## 2023-01-06 MED ORDER — GABAPENTIN 300 MG PO CAPS
600.0000 mg | ORAL_CAPSULE | Freq: Four times a day (QID) | ORAL | Status: DC
  Administered 2023-01-06: 600 mg via ORAL
  Filled 2023-01-06: qty 2

## 2023-01-06 MED ORDER — ALUM & MAG HYDROXIDE-SIMETH 200-200-20 MG/5ML PO SUSP: 30.0000 mL | ORAL | Status: DC | PRN

## 2023-01-06 MED ORDER — LORAZEPAM 1 MG PO TABS
1.0000 mg | ORAL_TABLET | Freq: Four times a day (QID) | ORAL | Status: DC | PRN
  Administered 2023-01-06: 1 mg via ORAL
  Filled 2023-01-06: qty 1

## 2023-01-06 MED ORDER — ZIPRASIDONE MESYLATE 20 MG IM SOLR: 20.0000 mg | INTRAMUSCULAR | Status: DC | PRN

## 2023-01-06 MED ORDER — HYDROXYZINE HCL 25 MG PO TABS: 25.0000 mg | ORAL_TABLET | Freq: Every evening | ORAL | Status: DC | PRN

## 2023-01-06 MED ORDER — AMPHETAMINE-DEXTROAMPHET ER 5 MG PO CP24
30.0000 mg | ORAL_CAPSULE | Freq: Every day | ORAL | Status: DC
  Filled 2023-01-06: qty 6

## 2023-01-06 NOTE — ED Notes (Signed)
Patient reused to take adderall . States that  she want lorazepam. Rn notified provider.

## 2023-01-06 NOTE — Discharge Instructions (Signed)
[facility] Address: [address] [place], [place], [place] [address] Phone: [phone]  Supported Employment The supported employment program is a person-centered, individualized, evidence-based support service that helps members choose, acquire, and maintain competitive employment in our community. This service supports the varying needs of individuals and promotes community inclusion and employment success. Members enrolled in the supported employment program can expect the following:  Development of an individual career plan Community based job placement Job shadowing Job development On-site job Furniture conservator/restorer and support  Supported Education Supported education helps our members receive the education and training they need to achieve their learning and recovery goals. This will assist members with becoming gainfully employed in the job or career of their choice. The program includes assistance with: Registering for disability accommodations Enrolling in school and registering for classes Learning communication skills Scheduling tutoring sessions within your school [facility] partners with Vocational Rehabilitation to help increase the success of clients seeking employment and educational goals.  Want to learn more about our programs?   Please contact our intake department INTAKE: [phone] Ext [phone]  Mailing: [address] [address]   [place], [place] [address]   [email or web address]   Interactive Resource Center  Hours Monday - Friday: Services: 8:00AM - 3:00PM Offices: 8:00AM - 5:00PM  Physical Address [address] [place], [place] [address]   Please use this address for [facility] Mailing Address [address] [address] [place], [place] [address]  The [facility] helps people reconnect This is a safe place to rest, take care of basic needs and access the services and community that make all the difference. Our guests come to the [facility] to take a class, do laundry, meet with a case  manager or to get their mail. Sometimes they just need to sit in our dayroom and enjoy a conversation.  Here you will find everything from shower facilities to a computer lab, a mail room, classrooms and meeting spaces.  The IRC helps people reconnect with their own lives and with the community at large.  A caring community setting One of the most exciting aspects of the IRC is that so many individuals and organizations in the community are a part of the everyday experience. Whether it's a hair stylist or law firm offering services right in-house, our partners make the [facility] a truly interactive resource center where services are brought to our guests. The IRC brings together a comprehensive community of talented people who not only want to help solve problems, but also to be a part of our guests' lives.  Integrated Care We take a person-centered approach to assistance that includes: Case management Geneticist, molecular Medical clinic Mental health nurse Referrals  Fundamental Services We start with necessities: Midwife and Armed forces operational officer addresses and mailboxes Replacement IDs Onsite barbershop Storage lockers [employer] Flag winter warming center  Self-Sufficiency We connect our guests with: Skilled trade classes Job skills classes Resume and jobs application assistance Interview training GED Producer, television/film/video literacy          Hours of Operation Mon 08:30 AM - 04:30 PM Tue 08:30 AM - 04:30 PM Wed 08:30 AM - 04:30 PM Thur 08:30 AM - 04:30 PM Fri 08:30 AM - 04:30 PM  About [place] [facility] [employer], we offer a whole-person approach to our services to give people with disabilities the confidence they need to reach their goals. Our programs inspire people of all abilities to live a full and independent life. At [facility], we provide dynamic and inclusive programs and services to empower  individuals with disabilities to live life fully. This includes our SSI and SSDI Benefits Assistance, accessible through your county advocate. [employer] offers accessories and clothing needed to put your best foot forward when searching for a job - call to make an appointment to shop our selection or donate to the cause. Other programs our team offers include our [employer], the [employer], and events that are both adaptive and inclusive for all. Our social support groups meet regularly in-person and virtually for both our youth and adult consumers. Contact us today to learn more about our programs serving those with disabilities.  Information and Referrals Helping you access the resources you need to achieve success. Navigating a maze of resources to find the right one for your needs can sometimes be challenging. At [facility], we understand how difficult it can be, which is why we offer information and referral services to help streamline your search. Whether you have questions about your eligibility for resources or need in-depth information about a particular topic, our connections and database can guide you to the resources you need to make informed choices. When you need support in finding the right path or taking the first step, contact our office to be connected with a Community Inclusion Specialist.  Systems and Individual Advocacy Providing advocacy that enhances the lives of people with disabilities. Systems and individual advocacy are a core component of our work at the [employer]. This vital service is part of the framework for helping to protect the rights and well-being of people with disabilities. In turn, these services try to eliminate systemic barriers, ensure people with disabilities have access to services, and fight for their rights and respect on both an individual and a larger scale. Our team provides these  much-needed services to all individuals with a disability, regardless of age, race, sexual orientation, etc. Individual advocacy means giving people with disabilities one-on-one assistance to help them deal with certain aspects of their lives and ensure their rights are respected. This could include help getting health care, an education, a place to live, a job, or social services. Our team works closely with the person with a disability to find out what they need, what they like, and their goals to ensure they get the support they need. Systems advocacy focuses on systemic problems and pushes for policy changes to help a wider group of people with disabilities. This is done through a dedication to changing laws, rules, policies, and practices that affect the lives of people with disabilities. Our team works closely with government agencies, [facility], and other stakeholders to make it easier for people with disabilities to get around, be a part of groups, and have equal chances. This includes such things as working for better public transportation, more accessible infrastructure, education for everyone, increased job prospects, and more.   Overall, systems and individual advocacy services work together to give people with disabilities well-rounded support. If you're curious about joining our cause or learning more about how systems and individual advocacy helps people with disabilities  Independent Living Skills We promote independence through specialized training. Independent life skills training for people with disabilities is a critical and powerful way to improve their quality of life, boost their self-confidence, and more. This specialized training gives them the tools, knowledge, and skills they need to live fulfilled and happy lives, no matter what challenges they face. At [facility], we offer this service to ensure that people with disabilities can thrive while  living independently and reach any goals they've set for themselves. Key components of independent living skills are detailed below. One of the biggest areas of independent living skills training is an introduction to daily living skills. This includes a wide range of things needed for everyday life, such as personal cleanliness, dressing, making meals, and cleaning the house. When people learn and master these skills, they gain a sense of pride and accomplishment that empowers them to continue pushing through boundaries. Another critical factor of independent living skills training is focused on mobility and transportation. This training might include the skills needed to utilize public methods of transport, safety precautions necessary when accessing these services, and how to understand routes to get from point A to point B. In addition, individuals with mobility or hearing and vision issues gain specialized training to gain the access they need. Learning how to handle money, make a budget, and plan for the future is another aspect of independent living skills training that can help people with disabilities have more control over their financial well-being. This means knowing the basics of money, having financial goals, and more. These are just a few of the aspects of independent living skills training. Each client will receive a customized plan to help them with their needs. By teaching a wide range of skills, our team can give our clients the tools they need to live self-sufficient and successful lives.  Peer Counseling Let us help you with safe and supportive services. Peer counseling is an invaluable and necessary resource for everyone, but it is especially critical for those with disabilities. With targeted support, this type of counseling provides individuals with a way to connect with others who have been through similar things, giving them a sense of belonging and strength. At [facility], peer counseling is an essential part of the services we offer. Continue reading to learn more about the benefits of group counseling, and then call our team to inquire about specific services. Peer counseling offers a chance to share with others and learn about healthy ideas and tips directly valuable for their lives. For people with disabilities, this is a chance to understand everyday life's battles, victories, frustrations, and successes in a comfortable and safe setting. This shared understanding makes a strong bond of empathy and sincerity. Also, having a disability can sometimes make a person feel alone, isolated, or even depressed. Peer counselors can help individuals feel better by being there to listen and acknowledge their feelings. They can offer a safe place to discuss feelings and fears without being judged, which reduces depression and provides empowerment. In addition to connecting with others, peer counseling can help strengthen self-confidence and decrease shame. And since everyone has their own unique story, the group setting provides an opportunity to learn empathy and compassion for others. Peer counseling is vital to help people with disabilities express their feelings and gain support. Institutional and Youth Support We can help you create a plan for smoother transitions. Anyone can find it hard to move from one stage of life to the next, but people with disabilities often face unique and more complex challenges. Ensuring success for those with disabilities requires careful planning, cooperation, and resources. At [facility], we offer institutional and youth transitions support to provide a roadmap that helps families and individuals understand their rights, explore their needs, and cultivate a plan that provides them the best chance  to succeed. Institutional transitions are when people with disabilities move from a structured setting, like a hospital or  residential care center, to a less structured one, like a community home. This change can be especially hard because it can throw off routines, cause changes in caregiving, and require getting used to a new setting. A person-centered plan that includes preferences, strengths, and wants is vital to ensure success. This plan should include a strong support team that understands the goal of institutional and youth transitions support and is focused on a smooth transition and continuity of care when available. In addition, family and caretaker support can fill in any missing pieces needed to craft a successful plan. Also included in institutional and youth transitions support is transitioning from a teenager to an adult. For people with disabilities, this time can be challenging since they often leave the structured school setting and need to navigate the often confusing world of adulthood. Learning the skills required to get a job, determining their college eligibility, and even understanding their options for independent living are all involved in a proper transitional plan. Like an institutional transition, the youth transition requires a skilled team to address the areas of concern and create a plan that covers all the bases. Our team can assist with institutional and youth transitions support by offering our years of experience in the field and access to much-needed training and resources.  Intake Process We follow a specific intake process to help the disabled.  At [facility], all participants must follow a specific intake process to benefit from our program. The first step involves contacting us directly. At this point, we will connect you with a Community Inclusion Specialist who will work with you to arrange an intake appointment. After filling out all paperwork at this appointment, you will receive an independent living plan that outlines your individual goals. Once you have this plan,  you are on your way to accessing a life of independence! For more information about our intake process or how to get started contact us today.

## 2023-01-06 NOTE — ED Notes (Signed)
Patient in milieu. Environment is secured. Will continue to monitor for safety. 
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
## 2023-01-06 NOTE — ED Provider Notes (Signed)
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 XR) 24 hr capsule 30 mg   tretinoin (RETIN-A) 0.05 % cream 1 Application   PTA Medications  Medication Sig   hydrOXYzine (ATARAX) 25 MG tablet Take 25 mg by mouth at bedtime as needed for anxiety.   LORazepam (ATIVAN) 1 MG tablet Take 1 mg by mouth 4 (four) times daily as needed for anxiety.   amphetamine-dextroamphetamine (ADDERALL XR) 30 MG 24 hr capsule Take 30 mg  by mouth daily.   gabapentin (NEURONTIN) 600 MG tablet Take 600 mg by mouth 4 (four) times daily.   tretinoin (RETIN-A) 0.05 % cream Apply 1 Application topically at bedtime.        No data to display          Flowsheet Row ED from 01/06/2023 in [facility] ED from 10/07/2022 in [facility] Emergency Department at [facility]  C-SSRS RISK CATEGORY No Risk No Risk       Musculoskeletal  Strength & Muscle Tone:  within normal limits Gait & Station: normal Patient leans: N/A  Psychiatric Specialty Exam  Presentation  General Appearance:  Appropriate for Environment  Eye Contact: Good  Speech: Normal Rate; Clear and Coherent  Speech Volume: Normal  Handedness: Right   Mood and Affect  Mood: Anxious; Euthymic  Affect: Appropriate; Congruent   Thought Process  Thought Processes: Coherent; Goal Directed; Linear  Descriptions of Associations:Circumstantial  Orientation:Full (Time, Place and Person)  Thought Content:Logical; WDL  Diagnosis of Schizophrenia or Schizoaffective disorder in past: No    Hallucinations:Hallucinations: None  Ideas of Reference:None  Suicidal Thoughts:Suicidal Thoughts: No  Homicidal Thoughts:Homicidal Thoughts: No   Sensorium  Memory: Immediate Good; Recent Good  Judgment: Intact  Insight: Present   Executive Functions  Concentration: Good  Attention Span: Good  Recall: Good  Fund of Knowledge: Good  Language: Good   Psychomotor Activity  Psychomotor Activity: Psychomotor Activity: Normal   Assets  Assets: Communication Skills; Desire for Improvement; Financial Resources/Insurance; Housing; Physical Health; Social Support; Leisure Time; Resilience   Sleep  Sleep: Sleep: Fair (Reporting she slept well last night) Number of Hours of Sleep: 2   Nutritional Assessment (For OBS and FBC admissions only) Has the patient had a weight loss or gain of 10 pounds or more in the last 3 months?: No Has the patient had a decrease in food intake/or appetite?: No Does the patient have dental problems?: No Does the patient have eating habits or behaviors that may be indicators of an eating disorder including binging or inducing vomiting?: No Has the patient recently lost weight without trying?: 0 Has the patient been eating poorly because of a decreased appetite?: 0 Malnutrition Screening Tool Score: 0    Physical Exam   Physical Exam Vitals and nursing note reviewed.  Constitutional:      Appearance: Normal appearance.  HENT:     Head: Normocephalic.  Eyes:     Conjunctiva/sclera: Conjunctivae normal.  Cardiovascular:     Rate and Rhythm: Normal rate.  Pulmonary:     Effort: Pulmonary effort is normal. No respiratory distress.  Musculoskeletal:        General: Normal range of motion.  Skin:    General: Skin is warm and dry.  Neurological:     Mental Status: She is alert and oriented to person, place, and time.  Psychiatric:        Attention and Perception: Attention and perception normal. She does not perceive auditory or visual hallucinations.        Mood and Affect: Affect normal. Mood is anxious.        Speech: Speech normal.        Behavior: Behavior normal. Behavior is cooperative.        Thought Content: Thought content normal. Thought content is not paranoid or delusional. Thought content does not include homicidal or suicidal ideation.        Cognition and Memory: Cognition normal.  Judgment: Judgment is impulsive.    Review of Systems  Constitutional:        No complaints voiced  Psychiatric/Behavioral:  Depression: Stable. Hallucinations: Denies. Substance abuse: Denies. Suicidal ideas: Denies. Nervous/anxious: Reporting she is feeling overwhelmed. Insomnia: Slept last night and is feeling better.    Blood pressure 124/89, pulse 76, temperature 97.8 F (36.6 C), temperature source Oral, resp. rate 17, SpO2 100 %. There is no height or weight on file to calculate BMI.  Demographic Factors:  Caucasian and female  Loss Factors: NA  Historical Factors: Impulsivity  Risk Reduction Factors:   Sense of responsibility to family, Religious beliefs about death, Living with another person, especially a relative, and Positive social support  Continued Clinical Symptoms:  Previous Psychiatric Diagnoses and Treatments  Cognitive Features That Contribute To Risk:  None     Suicide Risk:  Minimal: No identifiable suicidal ideation.  Patients presenting with no risk factors but with morbid ruminations; may be classified as minimal risk based on the severity of the depressive symptoms  Plan Of Care/Follow-up recommendations:  Other:  Follow up with resources and referral given  Disposition: No evidence of imminent risk to self or others at present.   Patient does not meet criteria for psychiatric inpatient admission. Supportive therapy provided about ongoing stressors. Refer to IOP. Discussed crisis plan, support from social network, calling 911, coming to the Emergency Department, and calling Suicide Hotline.  Aniyla Harling, NP 01/06/2023, 10:56 AM
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
## 2023-01-06 NOTE — ED Notes (Signed)
Patient  sleeping in no acute stress. RR even and unlabored .Environment secured .Will continue to monitor for safely. 

## 2023-01-06 NOTE — ED Notes (Signed)
Patient A&Ox4. Patient denies SI/Hi and AVH. Denies any physical complaints when asked. No acute distress noted. Support and encouragement provided. Routine safety checks conducted according to facility protocol. Encouraged patient to notify staff if thoughts of harm toward self or others arise. Patient verbalize understanding and agreement. Will continue to monitor for safety. 

## 2023-01-06 NOTE — ED Notes (Signed)
Patient A&O x 4, ambulatory. Patient discharged in no acute distress. Patient denied SI/HI, A/VH upon discharge. Patient verbalized understanding of all discharge instructions explained by staff, to include follow up appointments, RX's and safety plan.Pt belongings returned to patient from locker intact. Patient escorted to lobby via staff for transport to destination. Safety maintained. Patient was given taxi vocher to get to [facility] to get car
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
## 2023-01-06 NOTE — ED Notes (Signed)
Patient alert and oriented x 3. Denies SI/HI/AVH. Denies intent or plan to harm self or others. Routine conducted according to faculty protocol. Encourage patient to notify staff with any needs or concerns. Patient verbalized agreement and understanding. Will continue to monitor for safety. 

## 2023-01-08 ENCOUNTER — Telehealth (HOSPITAL_COMMUNITY): Payer: Self-pay | Admitting: Nurse Practitioner

## 2023-01-08 ENCOUNTER — Other Ambulatory Visit (HOSPITAL_COMMUNITY): Payer: Medicare Other

## 2023-01-09 ENCOUNTER — Ambulatory Visit (HOSPITAL_COMMUNITY): Payer: Medicare Other

## 2023-01-12 ENCOUNTER — Ambulatory Visit (HOSPITAL_COMMUNITY): Payer: Medicare Other

## 2023-01-13 ENCOUNTER — Ambulatory Visit (HOSPITAL_COMMUNITY): Payer: Medicare Other

## 2023-01-14 ENCOUNTER — Ambulatory Visit (HOSPITAL_COMMUNITY): Payer: Medicare Other

## 2023-01-15 ENCOUNTER — Ambulatory Visit (HOSPITAL_COMMUNITY): Payer: Medicare Other

## 2023-01-16 ENCOUNTER — Ambulatory Visit (HOSPITAL_COMMUNITY): Payer: Medicare Other

## 2023-01-19 ENCOUNTER — Ambulatory Visit (HOSPITAL_COMMUNITY): Payer: Medicare Other

## 2023-01-20 ENCOUNTER — Ambulatory Visit (HOSPITAL_COMMUNITY): Payer: Medicare Other

## 2023-01-21 ENCOUNTER — Ambulatory Visit (HOSPITAL_COMMUNITY): Payer: Medicare Other

## 2023-01-22 ENCOUNTER — Ambulatory Visit (HOSPITAL_COMMUNITY): Payer: Medicare Other

## 2023-01-23 ENCOUNTER — Ambulatory Visit (HOSPITAL_COMMUNITY): Payer: Medicare Other

## 2023-01-26 ENCOUNTER — Ambulatory Visit (HOSPITAL_COMMUNITY): Payer: Medicare Other

## 2023-01-27 ENCOUNTER — Ambulatory Visit (HOSPITAL_COMMUNITY): Payer: Medicare Other

## 2023-03-16 ENCOUNTER — Ambulatory Visit (INDEPENDENT_AMBULATORY_CARE_PROVIDER_SITE_OTHER): Payer: Medicare Other | Admitting: Licensed Clinical Social Worker

## 2023-03-16 DIAGNOSIS — F3161 Bipolar disorder, current episode mixed, mild: Secondary | ICD-10-CM

## 2023-03-16 DIAGNOSIS — Z639 Problem related to primary support group, unspecified: Secondary | ICD-10-CM | POA: Diagnosis not present
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
## 2023-05-13 ENCOUNTER — Ambulatory Visit (HOSPITAL_COMMUNITY): Payer: Medicare Other | Admitting: Licensed Clinical Social Worker

## 2023-05-14 ENCOUNTER — Ambulatory Visit (INDEPENDENT_AMBULATORY_CARE_PROVIDER_SITE_OTHER): Payer: Medicare Other | Admitting: Licensed Clinical Social Worker

## 2023-05-14 ENCOUNTER — Encounter (HOSPITAL_COMMUNITY): Payer: Self-pay

## 2023-05-14 DIAGNOSIS — Z639 Problem related to primary support group, unspecified: Secondary | ICD-10-CM

## 2023-05-14 DIAGNOSIS — F3161 Bipolar disorder, current episode mixed, mild: Secondary | ICD-10-CM | POA: Diagnosis not present
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
## 2023-05-27 ENCOUNTER — Ambulatory Visit (HOSPITAL_COMMUNITY): Payer: Medicare Other | Admitting: Licensed Clinical Social Worker

## 2023-05-29 ENCOUNTER — Ambulatory Visit (HOSPITAL_COMMUNITY): Payer: Medicare Other | Admitting: Licensed Clinical Social Worker

## 2023-06-04 ENCOUNTER — Ambulatory Visit (INDEPENDENT_AMBULATORY_CARE_PROVIDER_SITE_OTHER): Payer: Medicare Other | Admitting: Licensed Clinical Social Worker

## 2023-06-04 DIAGNOSIS — F3161 Bipolar disorder, current episode mixed, mild: Secondary | ICD-10-CM | POA: Diagnosis not present

## 2023-06-04 DIAGNOSIS — Z639 Problem related to primary support group, unspecified: Secondary | ICD-10-CM | POA: Diagnosis not present
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
## 2023-06-10 ENCOUNTER — Encounter (HOSPITAL_COMMUNITY): Payer: Self-pay | Admitting: Licensed Clinical Social Worker

## 2023-06-10 ENCOUNTER — Ambulatory Visit (HOSPITAL_COMMUNITY): Payer: Medicare Other | Admitting: Licensed Clinical Social Worker

## 2023-06-17 ENCOUNTER — Ambulatory Visit (HOSPITAL_COMMUNITY): Payer: Medicare Other | Admitting: Licensed Clinical Social Worker

## 2023-06-23 ENCOUNTER — Ambulatory Visit (HOSPITAL_COMMUNITY): Payer: Medicare Other | Admitting: Licensed Clinical Social Worker

## 2023-07-13 ENCOUNTER — Ambulatory Visit (HOSPITAL_COMMUNITY): Payer: Medicare Other | Admitting: Licensed Clinical Social Worker

## 2023-07-14 ENCOUNTER — Ambulatory Visit (HOSPITAL_COMMUNITY): Payer: Medicare Other | Admitting: Psychiatry

## 2023-08-11 ENCOUNTER — Ambulatory Visit (HOSPITAL_COMMUNITY): Payer: Medicare Other | Admitting: Psychiatry

## 2024-07-18 ENCOUNTER — Encounter (HOSPITAL_COMMUNITY): Payer: Self-pay

## 2024-07-18 ENCOUNTER — Other Ambulatory Visit: Payer: Self-pay

## 2024-07-18 ENCOUNTER — Emergency Department (HOSPITAL_COMMUNITY)
Admission: EM | Admit: 2024-07-18 | Discharge: 2024-07-18 | Disposition: A | Discharge status: Home / Self Care | Attending: Emergency Medicine | Admitting: Emergency Medicine

## 2024-07-18 DIAGNOSIS — R519 Headache, unspecified: Secondary | ICD-10-CM | POA: Diagnosis present

## 2024-07-18 DIAGNOSIS — I10 Essential (primary) hypertension: Secondary | ICD-10-CM | POA: Diagnosis not present

## 2024-07-18 DIAGNOSIS — H1013 Acute atopic conjunctivitis, bilateral: Secondary | ICD-10-CM | POA: Insufficient documentation

## 2024-07-18 DIAGNOSIS — Z79899 Other long term (current) drug therapy: Secondary | ICD-10-CM | POA: Insufficient documentation

## 2024-07-18 LAB — URINALYSIS, ROUTINE W REFLEX MICROSCOPIC
Bilirubin Urine: NEGATIVE
Glucose, UA: NEGATIVE mg/dL
Hgb urine dipstick: NEGATIVE
Ketones, ur: NEGATIVE mg/dL
Leukocytes,Ua: NEGATIVE
Nitrite: NEGATIVE
Protein, ur: NEGATIVE mg/dL
Specific Gravity, Urine: 1.012 (ref 1.005–1.030)
pH: 7 (ref 5.0–8.0)

## 2024-07-18 LAB — COMPREHENSIVE METABOLIC PANEL WITH GFR
ALT: 18 U/L (ref 0–44)
AST: 19 U/L (ref 15–41)
Albumin: 4.1 g/dL (ref 3.5–5.0)
Alkaline Phosphatase: 110 U/L (ref 38–126)
Anion gap: 10 (ref 5–15)
BUN: 11 mg/dL (ref 6–20)
CO2: 27 mmol/L (ref 22–32)
Calcium: 9.2 mg/dL (ref 8.9–10.3)
Chloride: 106 mmol/L (ref 98–111)
Creatinine, Ser: 0.8 mg/dL (ref 0.44–1.00)
GFR, Estimated: >60 mL/min (ref >60)
Glucose, Bld: 102 mg/dL — ABNORMAL HIGH (ref 70–99)
Potassium: 4 mmol/L (ref 3.5–5.1)
Sodium: 142 mmol/L (ref 135–145)
Total Bilirubin: 0.4 mg/dL (ref 0.0–1.2)
Total Protein: 6.9 g/dL (ref 6.5–8.1)

## 2024-07-18 LAB — CBC WITH DIFFERENTIAL/PLATELET
Abs Immature Granulocytes: 0.02 K/uL (ref 0.00–0.07)
Basophils Absolute: 0 K/uL (ref 0.0–0.1)
Basophils Relative: 0 %
Eosinophils Absolute: 0.1 K/uL (ref 0.0–0.5)
Eosinophils Relative: 2 %
HCT: 44.5 % (ref 36.0–46.0)
Hemoglobin: 14.6 g/dL (ref 12.0–15.0)
Immature Granulocytes: 0 %
Lymphocytes Relative: 19 %
Lymphs Abs: 1.6 K/uL (ref 0.7–4.0)
MCH: 31.2 pg (ref 26.0–34.0)
MCHC: 32.8 g/dL (ref 30.0–36.0)
MCV: 95.1 fL (ref 80.0–100.0)
Monocytes Absolute: 0.4 K/uL (ref 0.1–1.0)
Monocytes Relative: 5 %
Neutro Abs: 6.1 K/uL (ref 1.7–7.7)
Neutrophils Relative %: 74 %
Platelets: 291 K/uL (ref 150–400)
RBC: 4.68 MIL/uL (ref 3.87–5.11)
RDW: 14.7 % (ref 11.5–15.5)
WBC: 8.3 K/uL (ref 4.0–10.5)
nRBC: 0 % (ref 0.0–0.2)

## 2024-07-18 LAB — LIPASE, BLOOD: Lipase: 35 U/L (ref 11–51)

## 2024-07-18 MED ORDER — OLOPATADINE HCL 0.2 % OP SOLN: 1.0000 [drp] | Freq: Two times a day (BID) | OPHTHALMIC | 0 refills | Status: AC

## 2024-07-18 MED ORDER — GABAPENTIN 300 MG PO CAPS
300.0000 mg | ORAL_CAPSULE | Freq: Once | ORAL | Status: AC
  Administered 2024-07-18: 300 mg via ORAL
  Filled 2024-07-18: qty 1

## 2024-07-18 MED ORDER — HYDROCODONE-ACETAMINOPHEN 5-325 MG PO TABS
1.0000 | ORAL_TABLET | Freq: Once | ORAL | Status: AC
  Administered 2024-07-18: 1 via ORAL
  Filled 2024-07-18: qty 1

## 2024-07-18 MED ORDER — AMLODIPINE BESYLATE 10 MG PO TABS: 10.0000 mg | ORAL_TABLET | Freq: Every day | ORAL | 0 refills | Status: AC

## 2024-07-18 MED ORDER — HYDROCODONE-ACETAMINOPHEN 5-325 MG PO TABS: 1.0000 | ORAL_TABLET | Freq: Four times a day (QID) | ORAL | 0 refills | Status: AC | PRN
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
## 2024-07-18 NOTE — Discharge Instructions (Signed)
 Your lab tests and exam today are reassuring.  I am starting you on a low-dose blood pressure medication given your blood pressures today and your reported history of elevated numbers.  I am also prescribing medication to help you with your eye complaint, suspect this is secondary to allergies, try to avoid rubbing your eyes which can make this worse.  Cool compresses rather than rubbing the eyes are more soothing and will help to prevent periorbital skin irritation.  Plan follow-up care with your primary doctor as we discussed.  I have also ordered a blood test to rule out carbon monoxide since you are concerned about this, however this is highly unlikely since you do not have any exposures to carbon monoxide heat sources.  Regardless, I will contact you if this test is abnormal.

## 2024-07-19 NOTE — ED Provider Notes (Signed)
 [place] EMERGENCY DEPARTMENT AT [facility] Provider Note   CSN: [id number] Arrival date & time: 07/18/24  1056     Patient presents with: Headache   Michaela [name] is a 48 y.o. female with PMH including interstitial cystitis, anxiety/depression presenting with multiple complaints,  primarily concerns regarding posterior headache which has been present for about 6 weeks which she feels is due to blood pressure elevation.  She describes constant occipital pain without relief, not improved with otc meds including tylenol , excedrin .  She reports history of multiple elevated BP reading when seen at a local urgent care and by pcp similar to todays readings and is concerned she may need to be on medication. Was supposed to f/u with pcp but can't get in until next month. She denies vision changes, neck pain, cp,  sob, peripheral edema, dizziness or focal weakness.  Here with boyfriend with whom she lives who also complains of headache for about same length of time. Heat with electric, but used to heat the home with propane.  Boyfriend concerned they might still could be exposed to propane but also states the propane tank was removed years ago from the property.  He wants them both to get checked for CO exposure.  Also with complaint of bilateral eye watering and right eye itching for about 4 months, since she moved in with boyfriend. She frequently rubs the right eye because it itches. Endorses older home,  worried about possible environmental exposures.  Denies eye pain.  She has had no treatments for this prior to arrival.   Also has complaint of chronic low back pain.  Denies any recent injury, no weakness in extremities, no urinary complaint.  Worse with movement.       The history is provided by the patient.       Prior to Admission medications   Medication Sig Start Date End Date Taking? Authorizing Provider  amLODipine (NORVASC) 10 MG tablet Take 1 tablet (10 mg total) by  mouth daily. 07/18/24  Yes Dontasia Miranda, PA-C  HYDROcodone-acetaminophen  (NORCO/VICODIN) 5-325 MG tablet Take 1 tablet by mouth every 6 (six) hours as needed. 07/18/24  Yes Jerah Esty, PA-C  Olopatadine HCl 0.2 % SOLN Apply 1 drop to eye in the morning and at bedtime. 07/18/24  Yes Javian Nudd, PA-C  amphetamine -dextroamphetamine  (ADDERALL XR) 30 MG 24 hr capsule Take 30 mg by mouth daily.    [provider]  gabapentin  (NEURONTIN ) 600 MG tablet Take 0.5 tablets (300 mg total) by mouth 4 (four) times daily. 01/06/23   Rankin, Shuvon B, NP  hydrOXYzine  (ATARAX ) 25 MG tablet Take 25 mg by mouth at bedtime as needed for anxiety. 06/13/19   [provider]  LORazepam  (ATIVAN ) 1 MG tablet Take 1 tablet (1 mg total) by mouth 4 (four) times daily as needed for anxiety. 01/06/23   Rankin, Shuvon B, NP  tretinoin  (RETIN-A ) 0.05 % cream Apply 1 Application topically at bedtime.    [provider]    Allergies: Citrus and Equetro [carbamazepine er]    Review of Systems  Constitutional: Negative.  Negative for fever.  HENT: Negative.    Eyes:  Positive for itching.  Respiratory: Negative.    Cardiovascular: Negative.  Negative for chest pain and leg swelling.  Gastrointestinal: Negative.   Genitourinary: Negative.   Musculoskeletal:  Positive for back pain.  Neurological:  Positive for headaches. Negative for dizziness, weakness, light-headedness and numbness.  Psychiatric/Behavioral: Negative.      Updated Vital  Signs BP (!) 151/106   Pulse 92   Temp 98.3 F (36.8 C) (Oral)   Resp 16   Ht 5' 5 (1.651 m)   Wt 88.5 kg   SpO2 98%   BMI 32.45 kg/m   Physical Exam Vitals and nursing note reviewed.  Constitutional:      Appearance: She is well-developed.  HENT:     Head: Normocephalic and atraumatic.  Eyes:     General: Lids are normal.     Extraocular Movements: Extraocular movements intact.     Conjunctiva/sclera: Conjunctivae normal.     Right eye: Right  conjunctiva is not injected. No chemosis or exudate.    Left eye: Left conjunctiva is not injected. No chemosis or exudate.    Comments: Right maxilla and lower periocular skin pink,  appears mildly chafed. No ocular exudate.  No sig conjunctival injection.  Cardiovascular:     Rate and Rhythm: Normal rate and regular rhythm.     Heart sounds: Normal heart sounds.  Pulmonary:     Effort: Pulmonary effort is normal.     Breath sounds: Normal breath sounds. No wheezing.  Abdominal:     General: Bowel sounds are normal.     Palpations: Abdomen is soft.     Tenderness: There is no abdominal tenderness. There is no right CVA tenderness, left CVA tenderness or guarding.  Musculoskeletal:        General: Normal range of motion.     Cervical back: Normal range of motion.     Lumbar back: No swelling, tenderness or bony tenderness.  Skin:    General: Skin is warm and dry.  Neurological:     Mental Status: She is alert.     (all labs ordered are listed, but only abnormal results are displayed) Labs Reviewed  URINALYSIS, ROUTINE W REFLEX MICROSCOPIC - Abnormal; Notable for the following components:      Result Value   Color, Urine STRAW (*)    All other components within normal limits  COMPREHENSIVE METABOLIC PANEL WITH GFR - Abnormal; Notable for the following components:   Glucose, Bld 102 (*)    All other components within normal limits  CBC WITH DIFFERENTIAL/PLATELET  LIPASE, BLOOD    EKG: None  Radiology: No results found.   Procedures   Medications Ordered in the ED  gabapentin  (NEURONTIN ) capsule 300 mg (300 mg Oral Given 07/18/24 1446)  HYDROcodone-acetaminophen  (NORCO/VICODIN) 5-325 MG per tablet 1 tablet (1 tablet Oral Given 07/18/24 1520)                                    Medical Decision Making Pt presenting with multiple complaints, headache, eye irritation/chronic itching and watering,  low back pain and recent hx of elevated bp's.  She has no c/o eye pain  or vision changes - exam and hx suggests allergy component,  possible environmental since sx started shortly after moving into her boyfriends home.  She was prescribed allergy drops for this problem.    Chronic daily headache x 6 weeks .  She has no focal weakness on exam and no hx suggesting cva, denies head injury.  Ct head offered,  pt deferred.  She did sig elevated bp's on multiple checks here - will start on norvasc. Asked pt to check bp a few times and take #s to share with pcp next month.  Boyfriend very concerned about possible CO exposure, especially  since they have both had headaches.  Explained unlikely since the potential source (propane, gas) is no longer present at their home.   Of note, during stay,  pt had missed dose of gabapentin  for her IC and requested - given.     Amount and/or Complexity of Data Reviewed Labs: ordered.    Details: Labs reviewed, cbc, cmet, UA, negative.   Risk Prescription drug management.        Final diagnoses:  Hypertension, unspecified type  Chronic nonintractable headache, unspecified headache type  Allergic conjunctivitis of both eyes    ED Discharge Orders          Ordered    HYDROcodone-acetaminophen  (NORCO/VICODIN) 5-325 MG tablet  Every 6 hours PRN        07/18/24 1525    amLODipine (NORVASC) 10 MG tablet  Daily        07/18/24 1525    Olopatadine HCl 0.2 % SOLN  2 times daily        07/18/24 1525               Lynsee Wands, PA-C 07/19/24 PERVIS Suzette Pac, MD 07/22/24 [phone]

## 2024-10-04 ENCOUNTER — Encounter (INDEPENDENT_AMBULATORY_CARE_PROVIDER_SITE_OTHER): Payer: Self-pay | Admitting: *Deleted

## 2024-10-24 ENCOUNTER — Ambulatory Visit (HOSPITAL_BASED_OUTPATIENT_CLINIC_OR_DEPARTMENT_OTHER)

## 2024-11-21 ENCOUNTER — Ambulatory Visit (INDEPENDENT_AMBULATORY_CARE_PROVIDER_SITE_OTHER): Admitting: Gastroenterology
